# Patient Record
Sex: Female | Born: 1955 | Race: White | Hispanic: No | Marital: Married | Smoking: Former smoker
Health system: Southern US, Community
[De-identification: ages and names within clinical notes are randomized; demographics above are authoritative.]

## PROBLEM LIST (undated history)

## (undated) DIAGNOSIS — I10 Essential (primary) hypertension: Secondary | ICD-10-CM

## (undated) DIAGNOSIS — G4700 Insomnia, unspecified: Secondary | ICD-10-CM

## (undated) DIAGNOSIS — F32A Depression, unspecified: Secondary | ICD-10-CM

## (undated) DIAGNOSIS — Z8632 Personal history of gestational diabetes: Secondary | ICD-10-CM

## (undated) DIAGNOSIS — E7800 Pure hypercholesterolemia, unspecified: Secondary | ICD-10-CM

## (undated) DIAGNOSIS — M8588 Other specified disorders of bone density and structure, other site: Secondary | ICD-10-CM

## (undated) DIAGNOSIS — Z9889 Other specified postprocedural states: Secondary | ICD-10-CM

## (undated) DIAGNOSIS — F419 Anxiety disorder, unspecified: Secondary | ICD-10-CM

## (undated) DIAGNOSIS — B009 Herpesviral infection, unspecified: Secondary | ICD-10-CM

## (undated) HISTORY — PX: SKIN SURGERY: SHX2413

## (undated) HISTORY — DX: Herpesviral infection, unspecified: B00.9

## (undated) HISTORY — DX: Insomnia, unspecified: G47.00

## (undated) HISTORY — DX: Pure hypercholesterolemia, unspecified: E78.00

## (undated) HISTORY — DX: Anxiety disorder, unspecified: F41.9

## (undated) HISTORY — DX: Depression, unspecified: F32.A

## (undated) HISTORY — DX: Essential (primary) hypertension: I10

## (undated) HISTORY — DX: Other specified disorders of bone density and structure, other site: M85.88

## (undated) HISTORY — PX: COSMETIC SURGERY: SHX468

## (undated) HISTORY — DX: Other specified postprocedural states: Z98.890

## (undated) HISTORY — DX: Personal history of gestational diabetes: Z86.32

## (undated) HISTORY — PX: COLPOSCOPY: SHX161

---

## 1998-10-10 ENCOUNTER — Other Ambulatory Visit: Admission: RE | Admit: 1998-10-10 | Discharge: 1998-10-10 | Payer: Self-pay

## 1999-08-14 ENCOUNTER — Encounter: Admission: RE | Admit: 1999-08-14 | Discharge: 1999-08-14 | Payer: Self-pay | Admitting: Family Medicine

## 1999-08-14 ENCOUNTER — Encounter: Payer: Self-pay | Admitting: Family Medicine

## 1999-09-11 ENCOUNTER — Other Ambulatory Visit: Admission: RE | Admit: 1999-09-11 | Discharge: 1999-09-11 | Payer: Self-pay | Admitting: Obstetrics and Gynecology

## 2000-08-18 ENCOUNTER — Encounter: Payer: Self-pay | Admitting: Family Medicine

## 2000-08-18 ENCOUNTER — Encounter: Admission: RE | Admit: 2000-08-18 | Discharge: 2000-08-18 | Payer: Self-pay | Admitting: Family Medicine

## 2000-09-23 ENCOUNTER — Other Ambulatory Visit: Admission: RE | Admit: 2000-09-23 | Discharge: 2000-09-23 | Payer: Self-pay | Admitting: Obstetrics and Gynecology

## 2001-09-08 ENCOUNTER — Ambulatory Visit (HOSPITAL_COMMUNITY): Admission: RE | Admit: 2001-09-08 | Discharge: 2001-09-08 | Payer: Self-pay | Admitting: Obstetrics and Gynecology

## 2001-09-08 ENCOUNTER — Encounter: Payer: Self-pay | Admitting: Obstetrics and Gynecology

## 2001-09-29 ENCOUNTER — Other Ambulatory Visit: Admission: RE | Admit: 2001-09-29 | Discharge: 2001-09-29 | Payer: Self-pay | Admitting: Obstetrics and Gynecology

## 2002-04-06 ENCOUNTER — Encounter: Payer: Self-pay | Admitting: Family Medicine

## 2002-04-06 ENCOUNTER — Encounter: Admission: RE | Admit: 2002-04-06 | Discharge: 2002-04-06 | Payer: Self-pay | Admitting: Family Medicine

## 2002-09-10 ENCOUNTER — Encounter: Payer: Self-pay | Admitting: Obstetrics and Gynecology

## 2002-09-10 ENCOUNTER — Ambulatory Visit (HOSPITAL_COMMUNITY): Admission: RE | Admit: 2002-09-10 | Discharge: 2002-09-10 | Payer: Self-pay | Admitting: Obstetrics and Gynecology

## 2002-10-18 ENCOUNTER — Other Ambulatory Visit: Admission: RE | Admit: 2002-10-18 | Discharge: 2002-10-18 | Payer: Self-pay | Admitting: Obstetrics and Gynecology

## 2002-10-20 ENCOUNTER — Encounter: Payer: Self-pay | Admitting: Obstetrics and Gynecology

## 2002-10-20 ENCOUNTER — Encounter: Admission: RE | Admit: 2002-10-20 | Discharge: 2002-10-20 | Payer: Self-pay | Admitting: Obstetrics and Gynecology

## 2003-04-25 ENCOUNTER — Other Ambulatory Visit: Admission: RE | Admit: 2003-04-25 | Discharge: 2003-04-25 | Payer: Self-pay | Admitting: Obstetrics and Gynecology

## 2003-09-01 ENCOUNTER — Other Ambulatory Visit: Admission: RE | Admit: 2003-09-01 | Discharge: 2003-09-01 | Payer: Self-pay | Admitting: Obstetrics and Gynecology

## 2003-11-11 ENCOUNTER — Other Ambulatory Visit: Admission: RE | Admit: 2003-11-11 | Discharge: 2003-11-11 | Payer: Self-pay | Admitting: Gynecology

## 2003-12-13 ENCOUNTER — Ambulatory Visit (HOSPITAL_COMMUNITY): Admission: RE | Admit: 2003-12-13 | Discharge: 2003-12-13 | Payer: Self-pay | Admitting: Gynecology

## 2004-04-03 ENCOUNTER — Other Ambulatory Visit: Admission: RE | Admit: 2004-04-03 | Discharge: 2004-04-03 | Payer: Self-pay | Admitting: Gynecology

## 2004-10-08 ENCOUNTER — Other Ambulatory Visit: Admission: RE | Admit: 2004-10-08 | Discharge: 2004-10-08 | Payer: Self-pay | Admitting: Gynecology

## 2004-12-14 ENCOUNTER — Ambulatory Visit (HOSPITAL_COMMUNITY): Admission: RE | Admit: 2004-12-14 | Discharge: 2004-12-14 | Payer: Self-pay | Admitting: Gynecology

## 2005-01-29 ENCOUNTER — Other Ambulatory Visit: Admission: RE | Admit: 2005-01-29 | Discharge: 2005-01-29 | Payer: Self-pay | Admitting: Gynecology

## 2005-08-27 ENCOUNTER — Encounter: Admission: RE | Admit: 2005-08-27 | Discharge: 2005-08-27 | Payer: Self-pay | Admitting: Family Medicine

## 2005-12-16 ENCOUNTER — Ambulatory Visit (HOSPITAL_COMMUNITY): Admission: RE | Admit: 2005-12-16 | Discharge: 2005-12-16 | Payer: Self-pay | Admitting: Gynecology

## 2006-01-30 ENCOUNTER — Other Ambulatory Visit: Admission: RE | Admit: 2006-01-30 | Discharge: 2006-01-30 | Payer: Self-pay | Admitting: Gynecology

## 2006-12-19 ENCOUNTER — Ambulatory Visit (HOSPITAL_COMMUNITY): Admission: RE | Admit: 2006-12-19 | Discharge: 2006-12-19 | Payer: Self-pay | Admitting: Gynecology

## 2007-02-02 ENCOUNTER — Other Ambulatory Visit: Admission: RE | Admit: 2007-02-02 | Discharge: 2007-02-02 | Payer: Self-pay | Admitting: Gynecology

## 2007-12-21 ENCOUNTER — Ambulatory Visit (HOSPITAL_COMMUNITY): Admission: RE | Admit: 2007-12-21 | Discharge: 2007-12-21 | Payer: Self-pay | Admitting: Gynecology

## 2008-01-12 ENCOUNTER — Encounter: Admission: RE | Admit: 2008-01-12 | Discharge: 2008-01-27 | Payer: Self-pay | Admitting: Sports Medicine

## 2008-02-03 ENCOUNTER — Other Ambulatory Visit: Admission: RE | Admit: 2008-02-03 | Discharge: 2008-02-03 | Payer: Self-pay | Admitting: Gynecology

## 2008-03-01 ENCOUNTER — Ambulatory Visit: Payer: Self-pay | Admitting: Gynecology

## 2008-07-20 ENCOUNTER — Ambulatory Visit: Payer: Self-pay | Admitting: Gynecology

## 2008-09-20 ENCOUNTER — Ambulatory Visit: Payer: Self-pay | Admitting: Gynecology

## 2008-10-14 ENCOUNTER — Ambulatory Visit: Payer: Self-pay | Admitting: Gynecology

## 2008-10-27 ENCOUNTER — Ambulatory Visit: Payer: Self-pay | Admitting: Gynecology

## 2008-12-22 ENCOUNTER — Ambulatory Visit (HOSPITAL_COMMUNITY): Admission: RE | Admit: 2008-12-22 | Discharge: 2008-12-22 | Payer: Self-pay | Admitting: Gynecology

## 2009-02-03 ENCOUNTER — Encounter: Payer: Self-pay | Admitting: Gynecology

## 2009-02-03 ENCOUNTER — Other Ambulatory Visit: Admission: RE | Admit: 2009-02-03 | Discharge: 2009-02-03 | Payer: Self-pay | Admitting: Gynecology

## 2009-02-03 ENCOUNTER — Ambulatory Visit: Payer: Self-pay | Admitting: Gynecology

## 2009-02-16 ENCOUNTER — Ambulatory Visit: Payer: Self-pay | Admitting: Gynecology

## 2009-03-29 ENCOUNTER — Ambulatory Visit: Payer: Self-pay | Admitting: Women's Health

## 2009-04-03 ENCOUNTER — Ambulatory Visit: Payer: Self-pay | Admitting: Women's Health

## 2009-05-15 ENCOUNTER — Ambulatory Visit: Payer: Self-pay | Admitting: Gynecology

## 2009-05-17 ENCOUNTER — Ambulatory Visit: Payer: Self-pay | Admitting: Gynecology

## 2009-12-06 ENCOUNTER — Ambulatory Visit: Payer: Self-pay | Admitting: Gynecology

## 2009-12-25 ENCOUNTER — Ambulatory Visit (HOSPITAL_COMMUNITY): Admission: RE | Admit: 2009-12-25 | Discharge: 2009-12-25 | Payer: Self-pay | Admitting: Gynecology

## 2010-02-06 ENCOUNTER — Other Ambulatory Visit: Admission: RE | Admit: 2010-02-06 | Discharge: 2010-02-06 | Payer: Self-pay | Admitting: Gynecology

## 2010-02-06 ENCOUNTER — Ambulatory Visit: Payer: Self-pay | Admitting: Gynecology

## 2010-05-31 ENCOUNTER — Ambulatory Visit: Payer: Self-pay | Admitting: Gynecology

## 2010-06-27 ENCOUNTER — Ambulatory Visit
Admission: RE | Admit: 2010-06-27 | Discharge: 2010-06-27 | Payer: Self-pay | Source: Home / Self Care | Attending: Gynecology | Admitting: Gynecology

## 2010-07-18 ENCOUNTER — Ambulatory Visit: Payer: BC Managed Care – PPO | Admitting: Gynecology

## 2010-07-18 DIAGNOSIS — B373 Candidiasis of vulva and vagina: Secondary | ICD-10-CM

## 2010-07-18 DIAGNOSIS — N898 Other specified noninflammatory disorders of vagina: Secondary | ICD-10-CM

## 2010-07-18 DIAGNOSIS — L293 Anogenital pruritus, unspecified: Secondary | ICD-10-CM

## 2010-08-02 ENCOUNTER — Ambulatory Visit (INDEPENDENT_AMBULATORY_CARE_PROVIDER_SITE_OTHER): Payer: BC Managed Care – PPO | Admitting: Gynecology

## 2010-08-02 DIAGNOSIS — B373 Candidiasis of vulva and vagina: Secondary | ICD-10-CM

## 2010-08-02 DIAGNOSIS — N898 Other specified noninflammatory disorders of vagina: Secondary | ICD-10-CM

## 2010-10-22 ENCOUNTER — Ambulatory Visit (INDEPENDENT_AMBULATORY_CARE_PROVIDER_SITE_OTHER): Payer: BC Managed Care – PPO | Admitting: Gynecology

## 2010-10-22 DIAGNOSIS — B373 Candidiasis of vulva and vagina: Secondary | ICD-10-CM

## 2010-10-22 DIAGNOSIS — N898 Other specified noninflammatory disorders of vagina: Secondary | ICD-10-CM

## 2010-10-22 DIAGNOSIS — L293 Anogenital pruritus, unspecified: Secondary | ICD-10-CM

## 2010-11-27 ENCOUNTER — Other Ambulatory Visit: Payer: Self-pay | Admitting: Gynecology

## 2010-11-27 DIAGNOSIS — Z1231 Encounter for screening mammogram for malignant neoplasm of breast: Secondary | ICD-10-CM

## 2010-12-27 ENCOUNTER — Ambulatory Visit (HOSPITAL_COMMUNITY)
Admission: RE | Admit: 2010-12-27 | Discharge: 2010-12-27 | Disposition: A | Discharge status: Home / Self Care | Payer: BC Managed Care – PPO | Source: Ambulatory Visit | Attending: Gynecology | Admitting: Gynecology

## 2010-12-27 DIAGNOSIS — Z1231 Encounter for screening mammogram for malignant neoplasm of breast: Secondary | ICD-10-CM

## 2011-01-29 ENCOUNTER — Ambulatory Visit (INDEPENDENT_AMBULATORY_CARE_PROVIDER_SITE_OTHER): Payer: BC Managed Care – PPO | Admitting: Gynecology

## 2011-01-29 ENCOUNTER — Encounter: Payer: Self-pay | Admitting: Gynecology

## 2011-01-29 DIAGNOSIS — L293 Anogenital pruritus, unspecified: Secondary | ICD-10-CM

## 2011-01-29 DIAGNOSIS — N898 Other specified noninflammatory disorders of vagina: Secondary | ICD-10-CM

## 2011-01-29 DIAGNOSIS — B373 Candidiasis of vulva and vagina: Secondary | ICD-10-CM

## 2011-01-29 DIAGNOSIS — F419 Anxiety disorder, unspecified: Secondary | ICD-10-CM

## 2011-01-29 DIAGNOSIS — F411 Generalized anxiety disorder: Secondary | ICD-10-CM

## 2011-01-29 MED ORDER — CITALOPRAM HYDROBROMIDE 20 MG PO TABS: 20.0000 mg | ORAL_TABLET | Freq: Every day | ORAL | Status: DC

## 2011-01-29 MED ORDER — FLUCONAZOLE 200 MG PO TABS: 200.0000 mg | ORAL_TABLET | Freq: Every day | ORAL | Status: AC

## 2011-01-29 NOTE — Progress Notes (Signed)
Patient presents complaining of vaginal itching and a little bit of discharge. 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 She admits to missing some of her Vagifem and thinks that may be related to this. She's also having a lot of anxiety with her children going to college. She has crying spells and just feeling sad.  Exam Pelvic: External BUS vagina whitish discharge KOH wet prep done  Assessment and plan: #1 Yeast vaginitis. Wet prep returned positive for yeast under treat her with Diflucan 200 daily for 5 days as we have done the past which seems to eradicate her yeast. She is leaving on a trip tomorrow am afraid and a shorter course she may fail while she is out of town. #2 Anxiety crying spells. The patient's having situational anxiety with her children off at school, her husband and her now empty nesters. Various options were reviewed she wants to start on an anxiolytic and I prescribed Celexa 20 mg daily. I discussed the side effect profiles of up to and including the risks of worsening depression suicide ideation although she understands and accepts. #30 with 6 refills prescribed. Patient is due for her annual coming up and she'll follow up for this sooner if any issues.

## 2011-02-12 ENCOUNTER — Encounter: Payer: Self-pay | Admitting: Gynecology

## 2011-02-12 ENCOUNTER — Other Ambulatory Visit (HOSPITAL_COMMUNITY)
Admission: RE | Admit: 2011-02-12 | Discharge: 2011-02-12 | Disposition: A | Discharge status: Home / Self Care | Payer: BC Managed Care – PPO | Source: Ambulatory Visit | Attending: Gynecology | Admitting: Gynecology

## 2011-02-12 ENCOUNTER — Ambulatory Visit (INDEPENDENT_AMBULATORY_CARE_PROVIDER_SITE_OTHER): Payer: BC Managed Care – PPO | Admitting: Gynecology

## 2011-02-12 VITALS — BP 110/70 | Ht 65.75 in | Wt 124.5 lb

## 2011-02-12 DIAGNOSIS — B373 Candidiasis of vulva and vagina: Secondary | ICD-10-CM

## 2011-02-12 DIAGNOSIS — N952 Postmenopausal atrophic vaginitis: Secondary | ICD-10-CM

## 2011-02-12 DIAGNOSIS — Z79890 Hormone replacement therapy (postmenopausal): Secondary | ICD-10-CM

## 2011-02-12 DIAGNOSIS — L293 Anogenital pruritus, unspecified: Secondary | ICD-10-CM

## 2011-02-12 DIAGNOSIS — Z01419 Encounter for gynecological examination (general) (routine) without abnormal findings: Secondary | ICD-10-CM

## 2011-02-12 DIAGNOSIS — N898 Other specified noninflammatory disorders of vagina: Secondary | ICD-10-CM

## 2011-02-12 DIAGNOSIS — B3731 Acute candidiasis of vulva and vagina: Secondary | ICD-10-CM

## 2011-02-12 MED ORDER — ESTRADIOL 10 MCG VA TABS: 1.0000 | ORAL_TABLET | VAGINAL | Status: DC

## 2011-02-12 MED ORDER — PROGESTERONE MICRONIZED 200 MG PO CAPS: 200.0000 mg | ORAL_CAPSULE | Freq: Every day | ORAL | Status: DC

## 2011-02-12 MED ORDER — TERCONAZOLE 0.8 % VA CREA: 1.0000 | TOPICAL_CREAM | Freq: Every day | VAGINAL | Status: AC

## 2011-02-12 NOTE — Progress Notes (Signed)
Kristina Moody [facility] [birth year] [id number]        55 y.o.  for annual exam.  Currently on Vagifem 10 mcg 3 times weekly and estradiol 2 mg daily Prometrium 200 mg 13 days each month no bleeding doing well. Recently started on Celexa and has had a good response with a few crying spells. Was recently treated for yeast vulvovaginitis got better but now seems to have a recurrence of itching with discharge.  Past medical history,surgical history, medications, allergies, family history and social history were all reviewed and documented in the EPIC chart. ROS:  Was performed and pertinent positives and negatives are included in the history.  Exam: chaperone present Filed Vitals:   02/12/11 1406  BP: 110/70   General appearance  Normal Skin grossly normal Head/Neck normal with no cervical or supraclavicular adenopathy thyroid normal Lungs  clear Cardiac RR, without RMG Abdominal  soft, nontender, without masses, organomegaly or hernia Breasts  examined lying and sitting without masses, retractions, discharge or axillary adenopathy. Pelvic  Ext/BUS/vagina  normal white discharge KOH wet prep done mild atrophic genital changes noted  Cervix  normal  Pap done  Uterus  anteverted, normal size, shape and contour, midline and mobile nontender   Adnexa  Without masses or tenderness    Anus and perineum  normal   Rectovaginal  normal sphincter tone without palpated masses or tenderness.    Assessment/Plan:  55 y.o. female for annual exam.    #1 White discharge. KOH wet prep is positive for yeast we'll treat with Terazol 3 cream followup if symptoms persist or recur. #2 HRT. Patient is doing well on HRT with a combination of vaginal and oral estrogen supplementation. We again reviewed the [facility] study risks of stroke heart attack DVT breast cancer risk all reviewed she understands accepts and wants to continue. I refilled her estradiol 2 mg daily Prometrium 200 mg for 14 days each month again she does not  withdraw bleeding and Vagifem 10 mcg 3 times weekly. Patient knows to report any bleeding or other issues. #3 Anxiety. Patient's done well with Celexa and she is going to continue on this. She has enough refills through one year. #4 Health maintenance. Self breast exams on a monthly basis discussed encouraged. She just had mammography in June we'll continue with annual mammography.  Colonoscopy was 5 years ago and she knows to schedule one this fall as recommended by her gastroenterologist. Bone density last year showed osteopenia she is on extra calcium vitamin D with planned repeat bone density next year a two-year interval.  No blood work was done today as is all done through her primary's office.    Dara Lords MD, 2:59 PM 02/12/2011

## 2011-03-08 ENCOUNTER — Other Ambulatory Visit: Payer: Self-pay | Admitting: *Deleted

## 2011-03-08 NOTE — Telephone Encounter (Signed)
PHARMACY FAXED REFILL REQUEST. PLEASE ADVISE.

## 2011-03-08 NOTE — Telephone Encounter (Signed)
ERROR

## 2011-03-11 ENCOUNTER — Other Ambulatory Visit: Payer: Self-pay | Admitting: *Deleted

## 2011-03-11 MED ORDER — TERCONAZOLE 0.8 % VA CREA: 1.0000 | TOPICAL_CREAM | Freq: Every day | VAGINAL | Status: AC

## 2011-03-11 NOTE — Telephone Encounter (Signed)
Pharmacy faxed refill request. 

## 2011-03-12 ENCOUNTER — Telehealth: Payer: Self-pay | Admitting: *Deleted

## 2011-03-12 MED ORDER — ESTRADIOL 1 MG PO TABS: 1.0000 mg | ORAL_TABLET | Freq: Every day | ORAL | Status: DC

## 2011-03-12 MED ORDER — ESTROGENS CONJ SYNTHETIC A 1.25 MG PO TABS: 1.2500 mg | ORAL_TABLET | Freq: Every day | ORAL | Status: DC

## 2011-03-12 NOTE — Telephone Encounter (Signed)
Addended by: Richardson Chiquito on: 03/12/2011 04:52 PM   Modules accepted: Orders

## 2011-03-12 NOTE — Telephone Encounter (Signed)
Estradiol Rx on [employer] back order. Dr Reece Agar has a standing Rx change protocol. See new Rx. Pt informed. Kw

## 2011-03-12 NOTE — Telephone Encounter (Signed)
Pharmacy has some estradiol so therefore Rx changed back to that.

## 2011-06-18 ENCOUNTER — Encounter: Payer: Self-pay | Admitting: Gynecology

## 2011-06-18 ENCOUNTER — Ambulatory Visit (INDEPENDENT_AMBULATORY_CARE_PROVIDER_SITE_OTHER): Payer: BC Managed Care – PPO | Admitting: Gynecology

## 2011-06-18 DIAGNOSIS — N898 Other specified noninflammatory disorders of vagina: Secondary | ICD-10-CM

## 2011-06-18 LAB — WET PREP FOR TRICH, YEAST, CLUE
Clue Cells Wet Prep HPF POC: NONE SEEN
Yeast Wet Prep HPF POC: NONE SEEN

## 2011-06-18 MED ORDER — FLUCONAZOLE 200 MG PO TABS: 200.0000 mg | ORAL_TABLET | Freq: Every day | ORAL | Status: AC

## 2011-06-18 NOTE — Progress Notes (Signed)
Patient presents with history of recurrent yeast vulvovaginitis. She had missed her Vagifem per week and notes now some vaginal itching and discharge. She used one applicator Terazol cream she had last night.  Exam with Sherri chaperone present External BUS vagina with white discharge. Cervix grossly normal  Assessment and plan wet prep is negative but given history I wanted to cover her with Diflucan 200 daily x5 days as I think this is a partially treated yeast due to the [place] application. I did give her 2 refills available she does have recurrences. She will follow up if this does not resolve her symptoms.

## 2011-06-18 NOTE — Patient Instructions (Signed)
Take Diflucan as instructed. Follow up if your symptoms persist.

## 2011-07-12 ENCOUNTER — Encounter: Payer: Self-pay | Admitting: Gynecology

## 2011-07-12 ENCOUNTER — Ambulatory Visit (INDEPENDENT_AMBULATORY_CARE_PROVIDER_SITE_OTHER): Payer: BC Managed Care – PPO | Admitting: Gynecology

## 2011-07-12 DIAGNOSIS — N899 Noninflammatory disorder of vagina, unspecified: Secondary | ICD-10-CM

## 2011-07-12 DIAGNOSIS — N898 Other specified noninflammatory disorders of vagina: Secondary | ICD-10-CM

## 2011-07-12 LAB — WET PREP FOR TRICH, YEAST, CLUE: Yeast Wet Prep HPF POC: NONE SEEN

## 2011-07-12 MED ORDER — BETAMETHASONE DIPROPIONATE AUG 0.05 % EX CREA: TOPICAL_CREAM | Freq: Two times a day (BID) | CUTANEOUS | Status: AC

## 2011-07-12 MED ORDER — CLINDAMYCIN PHOSPHATE 2 % VA CREA: 1.0000 | TOPICAL_CREAM | Freq: Every day | VAGINAL | Status: AC

## 2011-07-12 NOTE — Patient Instructions (Signed)
Use Cleocin cream nightly x1 week. Apply steroid cream externally twice daily. Follow up if symptoms persist or recur

## 2011-07-12 NOTE — Progress Notes (Signed)
Patient presents with a one to two-week history of vulvar irritation. She does have a history of recurrent vaginitis with an underlying atrophic vaginitis. She's on oral and vaginal estrogen supplementation with Vagifem. She notes that a week and a half ago the onset of some irritation and started oral Diflucan 200x5 days and she had an external Monistat per irritation has persisted.  Exam was [name] chaperone present External with some mild skin cracking in the lower labial folds bilaterally. Vagina with whitish discharge. Cervix normal. Uterus normal size midline mobile nontender. Adnexa without masses or tenderness.  Assessment and plan: Wet prep shows TNBC bacteria, few clue cells, no amine, no yeast. Question partially treated yeast versus BV. She has been using Diflucan and Monistat go ahead and cover her for BV with Cleocin vaginal cream nightly times a week at her choice. I prescribed Diprolene 0.05% cream to apply twice a day externally for the your patient. Follow up if symptoms persist or recur.

## 2011-08-28 ENCOUNTER — Encounter: Payer: Self-pay | Admitting: Gynecology

## 2011-08-28 ENCOUNTER — Ambulatory Visit (INDEPENDENT_AMBULATORY_CARE_PROVIDER_SITE_OTHER): Payer: BC Managed Care – PPO | Admitting: Gynecology

## 2011-08-28 VITALS — BP 128/86

## 2011-08-28 DIAGNOSIS — Z79890 Hormone replacement therapy (postmenopausal): Secondary | ICD-10-CM

## 2011-08-28 DIAGNOSIS — F419 Anxiety disorder, unspecified: Secondary | ICD-10-CM

## 2011-08-28 DIAGNOSIS — F411 Generalized anxiety disorder: Secondary | ICD-10-CM

## 2011-08-28 MED ORDER — CITALOPRAM HYDROBROMIDE 20 MG PO TABS: 20.0000 mg | ORAL_TABLET | Freq: Every day | ORAL | Status: DC

## 2011-08-28 NOTE — Patient Instructions (Signed)
Follow up in 6 months for your annual gynecologic exam

## 2011-08-28 NOTE — Progress Notes (Signed)
Patient presents in follow up for Celexa management. She is started on this for anxiety about 6 months ago after follow up in 6 months just to see how she is doing. Patient reports doing great. It has stabilized her mood. She still dealing with elderly parents and wants to continue on this. Does not appear to have any side effects from this. I went ahead and refilled her medication for 6 months and she'll be due for her annual at that time. She's also doing well on her HRT having predictable withdrawal bleeds on her intermittent progesterone withdrawals. Assuming she continues well then she'll see me in 6 months for her annual exam.

## 2011-11-06 ENCOUNTER — Ambulatory Visit (INDEPENDENT_AMBULATORY_CARE_PROVIDER_SITE_OTHER): Payer: BC Managed Care – PPO | Admitting: Surgery

## 2011-11-06 ENCOUNTER — Encounter (INDEPENDENT_AMBULATORY_CARE_PROVIDER_SITE_OTHER): Payer: Self-pay | Admitting: Surgery

## 2011-11-06 VITALS — BP 150/70 | HR 84 | Temp 97.8°F | Resp 18 | Ht 66.0 in | Wt 128.0 lb

## 2011-11-06 DIAGNOSIS — K602 Anal fissure, unspecified: Secondary | ICD-10-CM

## 2011-11-06 MED ORDER — AMBULATORY NON FORMULARY MEDICATION: 1.0000 "application " | Freq: Four times a day (QID) | Status: DC

## 2011-11-06 NOTE — Progress Notes (Signed)
Kristina Moody comes in today with some pain in the perianal region thought to be hemorrhoids. She does have scheduled colonoscopies and is followed for that. The recent complaints of been post defecation bleeding which was bright red in nature and discomfort. There is some burning with this and what she considered to be fixed internal hemorrhoids.  I performed anoscopy  first with a small scope and then with the larger scope. Posteriorly she has what feels like a sentinel tag and there is a for row that isn't uncomfortable I think she is intermittently irritated an anal fissure.  I will give her a prescription for diltiazem cream. I will see her back in the office if needed in 6 weeks otherwise he will know to manage these expectantly. When they slurred up she had some anal rectal suppositories which apparently were effective.

## 2011-11-06 NOTE — Patient Instructions (Signed)
Avoid sitting for prolonged periods of time and straining on the toilet. He used the diltiazem cream as indicated. Avoid constipation.

## 2011-11-25 ENCOUNTER — Other Ambulatory Visit: Payer: Self-pay | Admitting: Gynecology

## 2011-11-25 DIAGNOSIS — Z1231 Encounter for screening mammogram for malignant neoplasm of breast: Secondary | ICD-10-CM

## 2011-11-26 ENCOUNTER — Telehealth: Payer: Self-pay | Admitting: *Deleted

## 2011-11-26 MED ORDER — TERCONAZOLE 0.8 % VA CREA: 1.0000 | TOPICAL_CREAM | Freq: Every day | VAGINAL | Status: AC

## 2011-11-26 NOTE — Telephone Encounter (Signed)
Pt will be leaving for vacation for 8 days this week and would like a Rx for Terazol cream to take with her.  She does not have yeast infection now, but would like medication on hand just in case it should occur. Pt said you have done this before. Please advise.

## 2011-11-26 NOTE — Telephone Encounter (Signed)
Pt informed with the below note. 

## 2011-11-26 NOTE — Telephone Encounter (Signed)
okay

## 2011-12-31 ENCOUNTER — Ambulatory Visit (HOSPITAL_COMMUNITY)
Admission: RE | Admit: 2011-12-31 | Discharge: 2011-12-31 | Disposition: A | Discharge status: Home / Self Care | Payer: BC Managed Care – PPO | Source: Ambulatory Visit | Attending: Gynecology | Admitting: Gynecology

## 2011-12-31 DIAGNOSIS — Z1231 Encounter for screening mammogram for malignant neoplasm of breast: Secondary | ICD-10-CM

## 2012-02-13 ENCOUNTER — Encounter: Payer: Self-pay | Admitting: Gynecology

## 2012-02-13 ENCOUNTER — Other Ambulatory Visit (HOSPITAL_COMMUNITY)
Admission: RE | Admit: 2012-02-13 | Discharge: 2012-02-13 | Disposition: A | Discharge status: Home / Self Care | Payer: BC Managed Care – PPO | Source: Ambulatory Visit | Attending: Gynecology | Admitting: Gynecology

## 2012-02-13 ENCOUNTER — Ambulatory Visit (INDEPENDENT_AMBULATORY_CARE_PROVIDER_SITE_OTHER): Payer: BC Managed Care – PPO | Admitting: Gynecology

## 2012-02-13 VITALS — BP 142/82 | Ht 65.75 in | Wt 129.0 lb

## 2012-02-13 DIAGNOSIS — N898 Other specified noninflammatory disorders of vagina: Secondary | ICD-10-CM

## 2012-02-13 DIAGNOSIS — Z1151 Encounter for screening for human papillomavirus (HPV): Secondary | ICD-10-CM | POA: Insufficient documentation

## 2012-02-13 DIAGNOSIS — Z79890 Hormone replacement therapy (postmenopausal): Secondary | ICD-10-CM

## 2012-02-13 DIAGNOSIS — Z01419 Encounter for gynecological examination (general) (routine) without abnormal findings: Secondary | ICD-10-CM

## 2012-02-13 LAB — WET PREP FOR TRICH, YEAST, CLUE
Trich, Wet Prep: NONE SEEN
Yeast Wet Prep HPF POC: NONE SEEN

## 2012-02-13 MED ORDER — ESTRADIOL 1 MG PO TABS: 1.0000 mg | ORAL_TABLET | Freq: Every day | ORAL | Status: DC

## 2012-02-13 MED ORDER — ESTROGENS, CONJUGATED 0.625 MG/GM VA CREA: TOPICAL_CREAM | Freq: Every day | VAGINAL | Status: AC

## 2012-02-13 MED ORDER — TERCONAZOLE 0.8 % VA CREA: 1.0000 | TOPICAL_CREAM | Freq: Every day | VAGINAL | Status: AC

## 2012-02-13 MED ORDER — PROGESTERONE MICRONIZED 200 MG PO CAPS: 200.0000 mg | ORAL_CAPSULE | Freq: Every day | ORAL | Status: DC

## 2012-02-13 MED ORDER — BETAMETHASONE DIPROPIONATE AUG 0.05 % EX CREA: TOPICAL_CREAM | Freq: Two times a day (BID) | CUTANEOUS | Status: DC

## 2012-02-13 NOTE — Progress Notes (Signed)
Kristina Moody [facility] 31-May-1956 [id number]        56 y.o.  [id number] for annual exam.  Several issues noted below.  Past medical history,surgical history, medications, allergies, family history and social history were all reviewed and documented in the EPIC chart. ROS:  Was performed and pertinent positives and negatives are included in the history.  Exam: [name] assistant Filed Vitals:   02/13/12 1353  BP: 142/82  Height: 5' 5.75" (1.67 m)  Weight: 129 lb (58.514 kg)   General appearance  Normal Skin grossly normal Head/Neck normal with no cervical or supraclavicular adenopathy thyroid normal Lungs  clear Cardiac RR, without RMG Abdominal  soft, nontender, without masses, organomegaly or hernia Breasts  examined lying and sitting without masses, retractions, discharge or axillary adenopathy. Pelvic  Ext/BUS/vagina  normal with mild atrophic changes  Cervix  normal with mild atrophic changes  Uterus  anteverted, normal size, shape and contour, midline and mobile nontender   Adnexa  Without masses or tenderness    Anus and perineum  normal   Rectovaginal  normal sphincter tone without palpated masses or tenderness.    Assessment/Plan:  56 y.o. [id number] female for annual exam.   1. HRT. Patient continues on Estrace 1 mg daily and Prometrium 200 mg daily for the first 13 days each month with no withdrawal bleeding. We discussed previously and again today the options of changing to a daily progesterone. Patient and I both agreed not to change it as she is not having any issues at this time as far as bleeding and good relief from hot flashes night sweats symptoms. He.  Risks of stroke heart attack DVT possible breast cancer risk of all been reviewed with the patient on multiple occasions and accepted. 2. Vaginitis. Patient recently had airplane trip and had a lot of vaginal irritation. She is to [employer] 3 day notes some persistent irritation. She has been using Diprolene in the past which seemed  to help with moisture generated vaginitis I refilled her Diprolene 0.05% to be used when necessary.  Wet prep today is negative. Will monitor symptoms. I did refill her Terazol 3 cream to have available for recurrences in the future. 3. Atrophic vaginitis/dyspareunia. Patients on Vagifem 10 mcg still with some issues. I suggested stopping this and trying estrogen cream externally several times weekly to see if this doesn't help with the atrophic changes she's going to try this I prescribed Premarin vaginal cream per her insurance coverage to apply several times weekly. 4. Mammography. Patient had mammogram in July. We'll continue with annual mammography. SBE monthly reviewed. 5. Osteopenia. DEXA 05/2010 showed T score -1.1. Plan repeat in another year or 2. Increase calcium vitamin D reviewed. 6. Pap smear. Last Pap smear 2011. Pap/HPV done today.  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 If this Pap is normal then plan less frequent screening interval. 7. Colonoscopy. Patient has scheduled in October will follow up for this. 8. Health maintenance. No blood work was done today as is all done through her primary physician's office who she sees on a regular basis. Mild elevated blood pressure was discussed with patient she will follow up and have this rechecked at their office. Follow up one year, sooner as needed.    Dara Lords MD, 3:09 PM 02/13/2012

## 2012-02-13 NOTE — Patient Instructions (Signed)
Follow up one year for annual gynecologic exam 

## 2012-02-14 ENCOUNTER — Encounter: Payer: Self-pay | Admitting: Gynecology

## 2012-04-06 ENCOUNTER — Other Ambulatory Visit: Payer: Self-pay | Admitting: Gastroenterology

## 2012-04-10 ENCOUNTER — Other Ambulatory Visit: Payer: Self-pay | Admitting: *Deleted

## 2012-04-10 MED ORDER — ESTRADIOL 10 MCG VA TABS: 10.0000 ug | ORAL_TABLET | VAGINAL | Status: DC

## 2012-07-13 ENCOUNTER — Ambulatory Visit (INDEPENDENT_AMBULATORY_CARE_PROVIDER_SITE_OTHER): Payer: No Typology Code available for payment source | Admitting: Gynecology

## 2012-07-13 ENCOUNTER — Encounter: Payer: Self-pay | Admitting: Gynecology

## 2012-07-13 DIAGNOSIS — Z79899 Other long term (current) drug therapy: Secondary | ICD-10-CM

## 2012-07-13 MED ORDER — PROGESTERONE MICRONIZED 200 MG PO CAPS: 200.0000 mg | ORAL_CAPSULE | Freq: Every day | ORAL | Status: DC

## 2012-07-13 MED ORDER — ONDANSETRON HCL 4 MG PO TABS: 4.0000 mg | ORAL_TABLET | Freq: Three times a day (TID) | ORAL | Status: DC | PRN

## 2012-07-13 MED ORDER — CIPROFLOXACIN HCL 500 MG PO TABS: 500.0000 mg | ORAL_TABLET | Freq: Two times a day (BID) | ORAL | Status: DC

## 2012-07-13 MED ORDER — FLUCONAZOLE 200 MG PO TABS: 200.0000 mg | ORAL_TABLET | Freq: Every day | ORAL | Status: DC

## 2012-07-13 NOTE — Progress Notes (Signed)
Patient presents to talk about medications for upcoming overseas trip. She is planning to travel to [place] and [place]. I recommended ciprofloxacin 500 mg twice a day x7 days to have available if UTI/respiratory/food poisoning. Zofran 4 mg #20 when necessary nausea. Diflucan 200 mg #5 when necessary yeast infection and she does get yeast infections frequently. I wrote for her Prometrium 200 mg #30 as she does take this 13 days each month with her HRT. Is having no withdrawal bleeding with this but apparently is having some insurance issues getting it filled early with her travels I gave her a supply of available.

## 2012-07-13 NOTE — Patient Instructions (Signed)
Take ciprofloxacin twice daily for one week if signs or symptoms of UTI, respiratory infection or food poisoning. Take Zofran every 8 hours for nausea as needed Take Diflucan tablet as needed for yeast.

## 2012-09-15 ENCOUNTER — Other Ambulatory Visit: Payer: Self-pay

## 2012-09-15 DIAGNOSIS — Z79890 Hormone replacement therapy (postmenopausal): Secondary | ICD-10-CM

## 2012-09-15 MED ORDER — PROGESTERONE MICRONIZED 200 MG PO CAPS: 200.0000 mg | ORAL_CAPSULE | Freq: Every day | ORAL | Status: DC

## 2012-12-28 ENCOUNTER — Other Ambulatory Visit: Payer: Self-pay | Admitting: Gynecology

## 2012-12-28 DIAGNOSIS — Z1231 Encounter for screening mammogram for malignant neoplasm of breast: Secondary | ICD-10-CM

## 2013-01-04 ENCOUNTER — Telehealth: Payer: Self-pay | Admitting: *Deleted

## 2013-01-04 MED ORDER — ZOLPIDEM TARTRATE 10 MG PO TABS: 10.0000 mg | ORAL_TABLET | Freq: Every evening | ORAL | Status: DC | PRN

## 2013-01-04 NOTE — Telephone Encounter (Signed)
Ambien 10 mg 1 by mouth each bedtime when necessary insomnia #30 no refill

## 2013-01-04 NOTE — Telephone Encounter (Signed)
rx called in, pt informed. 

## 2013-01-04 NOTE — Telephone Encounter (Signed)
Pt is getting ready to travel [place] and said you spoke with her about having rx to help with sleep? Pt called to see if you were still willing to give her this rx? Please advise

## 2013-01-05 ENCOUNTER — Ambulatory Visit (HOSPITAL_COMMUNITY): Payer: No Typology Code available for payment source

## 2013-02-02 ENCOUNTER — Ambulatory Visit (HOSPITAL_COMMUNITY)
Admission: RE | Admit: 2013-02-02 | Discharge: 2013-02-02 | Disposition: A | Discharge status: Home / Self Care | Payer: No Typology Code available for payment source | Source: Ambulatory Visit | Attending: Gynecology | Admitting: Gynecology

## 2013-02-02 DIAGNOSIS — Z1231 Encounter for screening mammogram for malignant neoplasm of breast: Secondary | ICD-10-CM | POA: Insufficient documentation

## 2013-03-03 ENCOUNTER — Other Ambulatory Visit: Payer: Self-pay | Admitting: *Deleted

## 2013-03-03 MED ORDER — ESTRADIOL 1 MG PO TABS: 1.0000 mg | ORAL_TABLET | Freq: Every day | ORAL | Status: DC

## 2013-03-11 ENCOUNTER — Telehealth: Payer: Self-pay | Admitting: *Deleted

## 2013-03-11 NOTE — Telephone Encounter (Signed)
Pt will be leaving for a 14 day overseas trip and would like a Rx for Ambien and Terazol to have on hand while on trip. Please advise

## 2013-03-15 MED ORDER — TERCONAZOLE 0.8 % VA CREA: 1.0000 | TOPICAL_CREAM | Freq: Every day | VAGINAL | Status: DC

## 2013-03-15 MED ORDER — ZOLPIDEM TARTRATE 10 MG PO TABS: 10.0000 mg | ORAL_TABLET | Freq: Every evening | ORAL | Status: DC | PRN

## 2013-03-15 NOTE — Telephone Encounter (Signed)
Terazol 3 day cream #1 kit Ambien 10 mg #20 one by mouth each bedtime when necessary insomnia no refill

## 2013-03-15 NOTE — Telephone Encounter (Signed)
Please see below note

## 2013-03-15 NOTE — Telephone Encounter (Signed)
Left message on pt voicemail to call regarding this. 

## 2013-03-15 NOTE — Telephone Encounter (Signed)
Ambien called into pharmacy, and Terazol 3 days cream sent. Pt informed,

## 2013-03-26 ENCOUNTER — Other Ambulatory Visit: Payer: Self-pay

## 2013-03-26 MED ORDER — ESTROGENS, CONJUGATED 0.625 MG/GM VA CREA: TOPICAL_CREAM | Freq: Every day | VAGINAL | Status: DC

## 2013-04-13 ENCOUNTER — Encounter: Payer: Self-pay | Admitting: Gynecology

## 2013-04-20 ENCOUNTER — Other Ambulatory Visit: Payer: Self-pay | Admitting: Gynecology

## 2013-04-20 NOTE — Telephone Encounter (Signed)
Has her CE scheduled in Dec 2014.

## 2013-05-05 ENCOUNTER — Other Ambulatory Visit: Payer: Self-pay | Admitting: Gynecology

## 2013-05-13 ENCOUNTER — Other Ambulatory Visit: Payer: Self-pay | Admitting: Gynecology

## 2013-05-20 ENCOUNTER — Ambulatory Visit (INDEPENDENT_AMBULATORY_CARE_PROVIDER_SITE_OTHER): Payer: No Typology Code available for payment source | Admitting: Gynecology

## 2013-05-20 ENCOUNTER — Encounter: Payer: Self-pay | Admitting: Gynecology

## 2013-05-20 VITALS — BP 124/80 | Ht 66.0 in | Wt 125.0 lb

## 2013-05-20 DIAGNOSIS — E7800 Pure hypercholesterolemia, unspecified: Secondary | ICD-10-CM | POA: Insufficient documentation

## 2013-05-20 DIAGNOSIS — N952 Postmenopausal atrophic vaginitis: Secondary | ICD-10-CM

## 2013-05-20 DIAGNOSIS — B009 Herpesviral infection, unspecified: Secondary | ICD-10-CM | POA: Insufficient documentation

## 2013-05-20 DIAGNOSIS — Z01419 Encounter for gynecological examination (general) (routine) without abnormal findings: Secondary | ICD-10-CM

## 2013-05-20 DIAGNOSIS — M8580 Other specified disorders of bone density and structure, unspecified site: Secondary | ICD-10-CM

## 2013-05-20 DIAGNOSIS — Z79890 Hormone replacement therapy (postmenopausal): Secondary | ICD-10-CM

## 2013-05-20 DIAGNOSIS — M899 Disorder of bone, unspecified: Secondary | ICD-10-CM

## 2013-05-20 LAB — URINALYSIS W MICROSCOPIC + REFLEX CULTURE
Bacteria, UA: NONE SEEN
Casts: NONE SEEN
Glucose, UA: NEGATIVE mg/dL
Hgb urine dipstick: NEGATIVE
Ketones, ur: NEGATIVE mg/dL
Protein, ur: NEGATIVE mg/dL
pH: 6.5 (ref 5.0–8.0)

## 2013-05-20 MED ORDER — PROGESTERONE MICRONIZED 200 MG PO CAPS: ORAL_CAPSULE | ORAL | Status: DC

## 2013-05-20 MED ORDER — BETAMETHASONE DIPROPIONATE AUG 0.05 % EX CREA: TOPICAL_CREAM | CUTANEOUS | Status: DC

## 2013-05-20 MED ORDER — ESTROGENS, CONJUGATED 0.625 MG/GM VA CREA: TOPICAL_CREAM | Freq: Every day | VAGINAL | Status: DC

## 2013-05-20 MED ORDER — ESTRADIOL 1 MG PO TABS: ORAL_TABLET | ORAL | Status: DC

## 2013-05-20 NOTE — Patient Instructions (Signed)
Followup for bone density as scheduled. Followup in one year for annual exam 

## 2013-05-20 NOTE — Progress Notes (Signed)
Nadia Viar [facility] 1956-04-26 [id number]        57 y.o.  [id number] for annual exam. Several issues below.  Past medical history,surgical history, problem list, medications, allergies, family history and social history were all reviewed and documented in the EPIC chart.  ROS:  Performed and pertinent positives and negatives are included in the history, assessment and plan .  Exam: Kim assistant Filed Vitals:   05/20/13 0806  BP: 124/80  Height: 5\' 6"  (1.676 m)  Weight: 125 lb (56.7 kg)   General appearance  Normal Skin grossly normal Head/Neck normal with no cervical or supraclavicular adenopathy thyroid normal Lungs  clear Cardiac RR, without RMG Abdominal  soft, nontender, without masses, organomegaly or hernia Breasts  examined lying and sitting without masses, retractions, discharge or axillary adenopathy. Pelvic  Ext/BUS/vagina  Normal with mild atrophic changes  Cervix  Normal   Uterus  anteverted, normal size, shape and contour, midline and mobile nontender   Adnexa  Without masses or tenderness    Anus and perineum  Normal   Rectovaginal  Normal sphincter tone without palpated masses or tenderness.    Assessment/Plan:  57 y.o. [id number] female for annual exam.   1. Postmenopausal/HRT. Patient continues on Estrace 1 mg daily and Prometrium 200 mg x14 days each month. Also supplementing with Premarin vaginal cream twice weekly. I again reviewed the risks benefits of HRT including increased risk of stroke heart attack DVT and breast cancer. Patient's comfortable with continuing and I refilled her x1 year. Also uses Diprolene 0.5% cream when necessary for external vaginal itching and I refilled this also. 2. Osteopenia. DEXA 05/2010 with T score -1.1. Repeat DEXA now. Increase calcium vitamin D reviewed. 3. Pap smear/HPV 2013. No Pap smear done today. 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 Will continue to screen at a less frequent interval. 4. Mammography  01/2013. Continue with annual mammography. SBE monthly review. 5. Colonoscopy 2014. Repeat at their recommended interval. 6. Health maintenance. No blood work done as it is all done through her primary physician's office. Followup for DEXA otherwise 1 year, sooner as needed   Note: This document was prepared with digital dictation and possible smart phrase technology. Any transcriptional errors that result from this process are unintentional.   Dara Lords MD, 8:41 AM 05/20/2013

## 2013-05-23 LAB — URINE CULTURE: Colony Count: 9000

## 2013-06-01 ENCOUNTER — Ambulatory Visit (INDEPENDENT_AMBULATORY_CARE_PROVIDER_SITE_OTHER): Payer: No Typology Code available for payment source

## 2013-06-01 ENCOUNTER — Encounter: Payer: Self-pay | Admitting: Gynecology

## 2013-06-01 DIAGNOSIS — M899 Disorder of bone, unspecified: Secondary | ICD-10-CM

## 2013-06-01 DIAGNOSIS — M8580 Other specified disorders of bone density and structure, unspecified site: Secondary | ICD-10-CM

## 2014-01-05 ENCOUNTER — Other Ambulatory Visit: Payer: Self-pay | Admitting: Gynecology

## 2014-01-05 DIAGNOSIS — Z1231 Encounter for screening mammogram for malignant neoplasm of breast: Secondary | ICD-10-CM

## 2014-02-04 ENCOUNTER — Ambulatory Visit (HOSPITAL_COMMUNITY)
Admission: RE | Admit: 2014-02-04 | Discharge: 2014-02-04 | Disposition: A | Discharge status: Home / Self Care | Payer: No Typology Code available for payment source | Source: Ambulatory Visit | Attending: Gynecology | Admitting: Gynecology

## 2014-02-04 DIAGNOSIS — Z1231 Encounter for screening mammogram for malignant neoplasm of breast: Secondary | ICD-10-CM | POA: Diagnosis present

## 2014-04-11 ENCOUNTER — Encounter: Payer: Self-pay | Admitting: Gynecology

## 2014-05-23 ENCOUNTER — Other Ambulatory Visit: Payer: Self-pay | Admitting: Gynecology

## 2014-05-24 ENCOUNTER — Ambulatory Visit (INDEPENDENT_AMBULATORY_CARE_PROVIDER_SITE_OTHER): Payer: No Typology Code available for payment source | Admitting: Gynecology

## 2014-05-24 ENCOUNTER — Encounter: Payer: Self-pay | Admitting: Gynecology

## 2014-05-24 ENCOUNTER — Encounter: Payer: No Typology Code available for payment source | Admitting: Gynecology

## 2014-05-24 VITALS — BP 120/68 | Ht 65.0 in | Wt 123.0 lb

## 2014-05-24 DIAGNOSIS — Z01419 Encounter for gynecological examination (general) (routine) without abnormal findings: Secondary | ICD-10-CM

## 2014-05-24 DIAGNOSIS — M8580 Other specified disorders of bone density and structure, unspecified site: Secondary | ICD-10-CM

## 2014-05-24 DIAGNOSIS — Z79890 Hormone replacement therapy (postmenopausal): Secondary | ICD-10-CM

## 2014-05-24 MED ORDER — PROGESTERONE MICRONIZED 200 MG PO CAPS: ORAL_CAPSULE | ORAL | Status: DC

## 2014-05-24 MED ORDER — ESTRADIOL 1 MG PO TABS: ORAL_TABLET | ORAL | Status: DC

## 2014-05-24 NOTE — Patient Instructions (Signed)
You may obtain a copy of any labs that were done today by logging onto MyChart as outlined in the instructions provided with your AVS (after visit summary). The office will not call with normal lab results but certainly if there are any significant abnormalities then we will contact you.   Health Maintenance, Female A healthy lifestyle and preventative care can promote health and wellness.  Maintain regular health, dental, and eye exams.  Eat a healthy diet. Foods like vegetables, fruits, whole grains, low-fat dairy products, and lean protein foods contain the nutrients you need without too many calories. Decrease your intake of foods high in solid fats, added sugars, and salt. Get information about a proper diet from your caregiver, if necessary.  Regular physical exercise is one of the most important things you can do for your health. Most adults should get at least 150 minutes of moderate-intensity exercise (any activity that increases your heart rate and causes you to sweat) each week. In addition, most adults need muscle-strengthening exercises on 2 or more days a week.   Maintain a healthy weight. The body mass index (BMI) is a screening tool to identify possible weight problems. It provides an estimate of body fat based on height and weight. Your caregiver can help determine your BMI, and can help you achieve or maintain a healthy weight. For adults 20 years and older:  A BMI below 18.5 is considered underweight.  A BMI of 18.5 to 24.9 is normal.  A BMI of 25 to 29.9 is considered overweight.  A BMI of 30 and above is considered obese.  Maintain normal blood lipids and cholesterol by exercising and minimizing your intake of saturated fat. Eat a balanced diet with plenty of fruits and vegetables. Blood tests for lipids and cholesterol should begin at age 61 and be repeated every 5 years. If your lipid or cholesterol levels are high, you are over 50, or you are a high risk for heart  disease, you may need your cholesterol levels checked more frequently.Ongoing high lipid and cholesterol levels should be treated with medicines if diet and exercise are not effective.  If you smoke, find out from your caregiver how to quit. If you do not use tobacco, do not start.  Lung cancer screening is recommended for adults aged 33 80 years who are at high risk for developing lung cancer because of a history of smoking. Yearly low-dose computed tomography (CT) is recommended for people who have at least a 30-pack-year history of smoking and are a current smoker or have quit within the past 15 years. A pack year of smoking is smoking an average of 1 pack of cigarettes a day for 1 year (for example: 1 pack a day for 30 years or 2 packs a day for 15 years). Yearly screening should continue until the smoker has stopped smoking for at least 15 years. Yearly screening should also be stopped for people who develop a health problem that would prevent them from having lung cancer treatment.  If you are pregnant, do not drink alcohol. If you are breastfeeding, be very cautious about drinking alcohol. If you are not pregnant and choose to drink alcohol, do not exceed 1 drink per day. One drink is considered to be 12 ounces (355 mL) of beer, 5 ounces (148 mL) of wine, or 1.5 ounces (44 mL) of liquor.  Avoid use of street drugs. Do not share needles with anyone. Ask for help if you need support or instructions about stopping  the use of drugs.  High blood pressure causes heart disease and increases the risk of stroke. Blood pressure should be checked at least every 1 to 2 years. Ongoing high blood pressure should be treated with medicines, if weight loss and exercise are not effective.  If you are 59 to 60 years old, ask your caregiver if you should take aspirin to prevent strokes.  Diabetes screening involves taking a blood sample to check your fasting blood sugar level. This should be done once every 3  years, after age 91, if you are within normal weight and without risk factors for diabetes. Testing should be considered at a younger age or be carried out more frequently if you are overweight and have at least 1 risk factor for diabetes.  Breast cancer screening is essential preventative care for women. You should practice "breast self-awareness." This means understanding the normal appearance and feel of your breasts and may include breast self-examination. Any changes detected, no matter how small, should be reported to a caregiver. Women in their 66s and 30s should have a clinical breast exam (CBE) by a caregiver as part of a regular health exam every 1 to 3 years. After age 101, women should have a CBE every year. Starting at age 100, women should consider having a mammogram (breast X-ray) every year. Women who have a family history of breast cancer should talk to their caregiver about genetic screening. Women at a high risk of breast cancer should talk to their caregiver about having an MRI and a mammogram every year.  Breast cancer gene (BRCA)-related cancer risk assessment is recommended for women who have family members with BRCA-related cancers. BRCA-related cancers include breast, ovarian, tubal, and peritoneal cancers. Having family members with these cancers may be associated with an increased risk for harmful changes (mutations) in the breast cancer genes BRCA1 and BRCA2. Results of the assessment will determine the need for genetic counseling and BRCA1 and BRCA2 testing.  The Pap test is a screening test for cervical cancer. Women should have a Pap test starting at age 57. Between ages 25 and 35, Pap tests should be repeated every 2 years. Beginning at age 37, you should have a Pap test every 3 years as long as the past 3 Pap tests have been normal. If you had a hysterectomy for a problem that was not cancer or a condition that could lead to cancer, then you no longer need Pap tests. If you are  between ages 50 and 76, and you have had normal Pap tests going back 10 years, you no longer need Pap tests. If you have had past treatment for cervical cancer or a condition that could lead to cancer, you need Pap tests and screening for cancer for at least 20 years after your treatment. If Pap tests have been discontinued, risk factors (such as a new sexual partner) need to be reassessed to determine if screening should be resumed. Some women have medical problems that increase the chance of getting cervical cancer. In these cases, your caregiver may recommend more frequent screening and Pap tests.  The human papillomavirus (HPV) test is an additional test that may be used for cervical cancer screening. The HPV test looks for the virus that can cause the cell changes on the cervix. The cells collected during the Pap test can be tested for HPV. The HPV test could be used to screen women aged 44 years and older, and should be used in women of any age  who have unclear Pap test results. After the age of 55, women should have HPV testing at the same frequency as a Pap test.  Colorectal cancer can be detected and often prevented. Most routine colorectal cancer screening begins at the age of 44 and continues through age 20. However, your caregiver may recommend screening at an earlier age if you have risk factors for colon cancer. On a yearly basis, your caregiver may provide home test kits to check for hidden blood in the stool. Use of a small camera at the end of a tube, to directly examine the colon (sigmoidoscopy or colonoscopy), can detect the earliest forms of colorectal cancer. Talk to your caregiver about this at age 86, when routine screening begins. Direct examination of the colon should be repeated every 5 to 10 years through age 13, unless early forms of pre-cancerous polyps or small growths are found.  Hepatitis C blood testing is recommended for all people born from [birth year] through 1967 and any  individual with known risks for hepatitis C.  Practice safe sex. Use condoms and avoid high-risk sexual practices to reduce the spread of sexually transmitted infections (STIs). Sexually active women aged 36 and younger should be checked for Chlamydia, which is a common sexually transmitted infection. Older women with new or multiple partners should also be tested for Chlamydia. Testing for other STIs is recommended if you are sexually active and at increased risk.  Osteoporosis is a disease in which the bones lose minerals and strength with aging. This can result in serious bone fractures. The risk of osteoporosis can be identified using a bone density scan. Women ages 20 and over and women at risk for fractures or osteoporosis should discuss screening with their caregivers. Ask your caregiver whether you should be taking a calcium supplement or vitamin D to reduce the rate of osteoporosis.  Menopause can be associated with physical symptoms and risks. Hormone replacement therapy is available to decrease symptoms and risks. You should talk to your caregiver about whether hormone replacement therapy is right for you.  Use sunscreen. Apply sunscreen liberally and repeatedly throughout the day. You should seek shade when your shadow is shorter than you. Protect yourself by wearing long sleeves, pants, a wide-brimmed hat, and sunglasses year round, whenever you are outdoors.  Notify your caregiver of new moles or changes in moles, especially if there is a change in shape or color. Also notify your caregiver if a mole is larger than the size of a pencil eraser.  Stay current with your immunizations. Document Released: 12/10/2010 Document Revised: 09/21/2012 Document Reviewed: 12/10/2010 [facility] Patient Information 2014 [employer].

## 2014-05-24 NOTE — Progress Notes (Signed)
Kristina Moody [facility] 31-May-1956 [id number]        58 y.o.  [id number] for annual exam.  Several issues noted below.  Past medical history,surgical history, problem list, medications, allergies, family history and social history were all reviewed and documented as reviewed in the EPIC chart.  ROS:  12 system ROS performed with pertinent positives and negatives included in the history, assessment and plan.   Additional significant findings :  none   Exam: [name] Vitals:   05/24/14 1036  BP: 120/68  Height: 5\' 5"  (1.651 m)  Weight: 123 lb (55.792 kg)   General appearance:  Normal affect, orientation and appearance. Skin: Grossly normal HEENT: Without gross lesions.  No cervical or supraclavicular adenopathy. Thyroid normal.  Lungs:  Clear without wheezing, rales or rhonchi Cardiac: RR, without RMG Abdominal:  Soft, nontender, without masses, guarding, rebound, organomegaly or hernia Breasts:  Examined lying and sitting without masses, retractions, discharge or axillary adenopathy. Pelvic:  Ext/BUS/vagina normal with mild atrophic changes  Cervix normal with atrophic changes  Uterus anteverted, normal size, shape and contour, midline and mobile nontender   Adnexa  Without masses or tenderness    Anus and perineum  Normal   Rectovaginal  Normal sphincter tone without palpated masses or tenderness.    Assessment/Plan:  58 y.o. [id number] female for annual exam.   1. Postmenopausal/HRT.  Continues on estradiol 1 mg daily and Prometrium 200 mg 13 days each month. No bleeding after the Prometrium. I again reviewed the whole issue of HRT to include the WHI study with increased risk of stroke heart attack DVT and breast cancer. The issues as to when to wean as well as the timing hypotheses as far as starting HRT early and possible long-term benefits. After lengthy discussion the patient wants to continue and I refilled her medications 1 year. She uses occasional Premarin vaginal cream  externally when she has extra dryness but does not do this very often. She has a supply at home and will call if she needs more. 2. Osteopenia. DEXA 05/2013 T score -1.4 FRAX 13%/0.6%. Increased calcium and vitamin D reviewed. Repeat DEXA in the next several years. 3. Pap smear/HPV 2013. No Pap smear done today. 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 Repeat at 3-5 year interval for current screening guidelines. 4. Mammography 01/2014. Continue with annual mammography. SBE monthly reviewed. 5. Colonoscopy 2014. Repeat at their recommended interval. 6. Health maintenance. No routine blood work done as she has this done at her primary physician's office. Follow up 1 year, sooner as needed.     [name] MD, 11:22 AM 05/24/2014

## 2014-05-25 LAB — URINALYSIS W MICROSCOPIC + REFLEX CULTURE
Bacteria, UA: NONE SEEN
Bilirubin Urine: NEGATIVE
CASTS: NONE SEEN
Crystals: NONE SEEN
GLUCOSE, UA: NEGATIVE mg/dL
Hgb urine dipstick: NEGATIVE
Ketones, ur: NEGATIVE mg/dL
LEUKOCYTES UA: NEGATIVE
Nitrite: NEGATIVE
PH: 6 (ref 5.0–8.0)
Protein, ur: NEGATIVE mg/dL
SQUAMOUS EPITHELIAL / LPF: NONE SEEN
Urobilinogen, UA: 0.2 mg/dL (ref 0.0–1.0)

## 2014-09-14 ENCOUNTER — Telehealth: Payer: Self-pay | Admitting: *Deleted

## 2014-09-14 MED ORDER — VALACYCLOVIR HCL 500 MG PO TABS: ORAL_TABLET | ORAL | Status: DC

## 2014-09-14 NOTE — Telephone Encounter (Signed)
Valtrex 500 mg twice a day 5 days #20 refill 1

## 2014-09-14 NOTE — Telephone Encounter (Signed)
Rx sent, pt informed. 

## 2014-09-14 NOTE — Telephone Encounter (Signed)
Pt is requesting a new Rx for valacyclovir or Zovirax for HSV outbreak. Pt said her old Rx has expired, last outbreak was 4 years ago. Please advise

## 2014-11-16 ENCOUNTER — Other Ambulatory Visit: Payer: Self-pay | Admitting: Family Medicine

## 2014-11-16 ENCOUNTER — Ambulatory Visit: Payer: No Typology Code available for payment source

## 2014-11-16 ENCOUNTER — Ambulatory Visit
Admission: RE | Admit: 2014-11-16 | Discharge: 2014-11-16 | Disposition: A | Discharge status: Home / Self Care | Payer: No Typology Code available for payment source | Source: Ambulatory Visit | Attending: Family Medicine | Admitting: Family Medicine

## 2014-11-16 DIAGNOSIS — R059 Cough, unspecified: Secondary | ICD-10-CM

## 2014-11-16 DIAGNOSIS — R05 Cough: Secondary | ICD-10-CM

## 2015-01-06 ENCOUNTER — Other Ambulatory Visit: Payer: Self-pay | Admitting: Gynecology

## 2015-01-06 DIAGNOSIS — Z1231 Encounter for screening mammogram for malignant neoplasm of breast: Secondary | ICD-10-CM

## 2015-01-19 ENCOUNTER — Ambulatory Visit: Payer: No Typology Code available for payment source | Admitting: Podiatry

## 2015-02-06 ENCOUNTER — Ambulatory Visit (HOSPITAL_COMMUNITY)
Admission: RE | Admit: 2015-02-06 | Discharge: 2015-02-06 | Disposition: A | Discharge status: Home / Self Care | Payer: No Typology Code available for payment source | Source: Ambulatory Visit | Attending: Gynecology | Admitting: Gynecology

## 2015-02-06 DIAGNOSIS — Z1231 Encounter for screening mammogram for malignant neoplasm of breast: Secondary | ICD-10-CM | POA: Insufficient documentation

## 2015-05-27 ENCOUNTER — Other Ambulatory Visit: Payer: Self-pay | Admitting: Gynecology

## 2015-06-03 ENCOUNTER — Ambulatory Visit (INDEPENDENT_AMBULATORY_CARE_PROVIDER_SITE_OTHER): Payer: No Typology Code available for payment source | Admitting: Physician Assistant

## 2015-06-03 ENCOUNTER — Ambulatory Visit (INDEPENDENT_AMBULATORY_CARE_PROVIDER_SITE_OTHER): Payer: No Typology Code available for payment source

## 2015-06-03 VITALS — BP 112/70 | HR 86 | Temp 98.1°F | Resp 14 | Ht 65.5 in | Wt 128.4 lb

## 2015-06-03 DIAGNOSIS — S93402A Sprain of unspecified ligament of left ankle, initial encounter: Secondary | ICD-10-CM | POA: Diagnosis not present

## 2015-06-03 DIAGNOSIS — M25572 Pain in left ankle and joints of left foot: Secondary | ICD-10-CM

## 2015-06-03 NOTE — Progress Notes (Signed)
06/05/2015 9:19 PM   DOB: [birth year] / MRN: [id number]  SUBJECTIVE:  Ankle Pain  The incident occurred 12 to 24 hours ago. The incident occurred in the street. The injury mechanism was an inversion injury and a twisting injury. The pain is present in the left ankle. The quality of the pain is described as stabbing. The pain is moderate. Pertinent negatives include no inability to bear weight, loss of motion, loss of sensation, muscle weakness, numbness or tingling. Associated symptoms comments: Swelling and bruising. She reports no foreign bodies present. The symptoms are aggravated by movement. She has tried ice and rest for the symptoms. The treatment provided mild relief.     She is allergic to codeine.   She  has a past medical history of Elevated cholesterol; HSV (herpes simplex virus) infection; Osteopenia (05/2013); History of colposcopy with cervical biopsy (2004, 2005); gestational diabetes; and Hypertension.    She  reports that she has quit smoking. She has never used smokeless tobacco. She reports that she drinks about 3.0 oz of alcohol per week. She reports that she does not use illicit drugs. She  reports that she currently engages in sexual activity and has had female partners. She reports using the following method of birth control/protection: Post-menopausal. The patient  has past surgical history that includes Cosmetic surgery; Skin surgery; Cesarean section (06/15/1992); and Colposcopy.  Her family history includes Cancer in her father; Dementia in her mother; Diabetes in her mother; Heart disease in her father and mother; Hypertension in her father and mother.  Review of Systems  Constitutional: Negative for fever.  Musculoskeletal: Positive for joint pain and falls.  Skin: Negative for rash.  Neurological: Negative for dizziness, tingling, sensory change and numbness.    Problem list and medications reviewed and updated by myself where necessary, and exist elsewhere in the  encounter.   OBJECTIVE:  BP 112/70 mmHg  Pulse 86  Temp(Src) 98.1 F (36.7 C) (Oral)  Resp 14  Ht 5' 5.5" (1.664 m)  Wt 128 lb 6.4 oz (58.242 kg)  BMI 21.03 kg/m2  SpO2 97%  LMP 09/11/2008  Physical Exam  Constitutional: She is oriented to person, place, and time. She appears well-nourished. No distress.  Eyes: EOM are normal. Pupils are equal, round, and reactive to light.  Cardiovascular: Normal rate.   Pulmonary/Chest: Effort normal.  Abdominal: She exhibits no distension.  Musculoskeletal:       Right ankle: Normal.       Left ankle: She exhibits ecchymosis. She exhibits normal range of motion, no deformity and normal pulse. Tenderness. Lateral malleolus and AITFL tenderness found. No CF ligament, no posterior TFL, no head of 5th metatarsal and no proximal fibula tenderness found. Achilles tendon normal.  Neurological: She is alert and oriented to person, place, and time. No cranial nerve deficit. Gait normal.  Skin: Skin is dry. She is not diaphoretic.  Psychiatric: She has a normal mood and affect.  Vitals reviewed.   No results found for this or any previous visit (from the past 48 hour(s)).  ASSESSMENT AND PLAN  Kristina Moody was seen today for ankle pain.  Diagnoses and all orders for this visit:  Left ankle pain: Rads negative for fracture.   -     DG Ankle Complete Left; Future  Sprain of ankle, left, initial encounter: Ace bandage provided. Advised 600-800 mg Ibuprofen for pain, Ice for swelling. Advised patient to allow two to three weeks for full recovery.      The patient  was advised to call or return to clinic if she does not see an improvement in symptoms or to seek the care of the closest emergency department if she worsens with the above plan.   Philis Fendt, MHS, PA-C [facility] 06/05/2015 9:19 PM

## 2015-06-03 NOTE — Patient Instructions (Signed)
Elastic Bandage and RICE °WHAT DOES AN ELASTIC BANDAGE DO? °Elastic bandages come in different shapes and sizes. They generally provide support to your injury and reduce swelling while you are healing, but they can perform different functions. Your health care provider will help you to decide what is best for your protection, recovery, or rehabilitation following an injury. °WHAT ARE SOME GENERAL TIPS FOR USING AN ELASTIC BANDAGE? °· Use the bandage as directed by the maker of the bandage that you are using. °· Do not wrap the bandage too tightly. This may cut off the circulation in the arm or leg in the area below the bandage. °¨ If part of your body beyond the bandage becomes blue, numb, cold, swollen, or is more painful, your bandage is most likely too tight. If this occurs, remove your bandage and reapply it more loosely. °· See your health care provider if the bandage seems to be making your problems worse rather than better. °· An elastic bandage should be removed and reapplied every 3-4 hours or as directed by your health care provider. °WHAT IS RICE? °The routine care of many injuries includes rest, ice, compression, and elevation (RICE therapy).  °Rest °Rest is required to allow your body to heal. Generally, you can resume your routine activities when you are comfortable and have been given permission by your health care provider. °Ice °Icing your injury helps to keep the swelling down and it reduces pain. Do not apply ice directly to your skin. °· Put ice in a plastic bag. °· Place a towel between your skin and the bag. °· Leave the ice on for 20 minutes, 2-3 times per day. °Do this for as long as you are directed by your health care provider. °Compression °Compression helps to keep swelling down, gives support, and helps with discomfort. Compression may be done with an elastic bandage. °Elevation °Elevation helps to reduce swelling and it decreases pain. If possible, your injured area should be placed at  or above the level of your heart or the center of your chest. °WHEN SHOULD I SEEK MEDICAL CARE? °You should seek medical care if: °· You have persistent pain and swelling. °· Your symptoms are getting worse rather than improving. °These symptoms may indicate that further evaluation or further X-rays are needed. Sometimes, X-rays may not show a small broken bone (fracture) until a number of days later. Make a follow-up appointment with your health care provider. Ask when your X-ray results will be ready. Make sure that you get your X-ray results. °WHEN SHOULD I SEEK IMMEDIATE MEDICAL CARE? °You should seek immediate medical care if: °· You have a sudden onset of severe pain at or below the area of your injury. °· You develop redness or increased swelling around your injury. °· You have tingling or numbness at or below the area of your injury that does not improve after you remove the elastic bandage. °  °This information is not intended to replace advice given to you by your health care provider. Make sure you discuss any questions you have with your health care provider. °  °Document Released: 11/16/2001 Document Revised: 02/15/2015 Document Reviewed: 01/10/2014 °Elsevier Interactive Patient Education ©2016 [employer] ° °

## 2015-06-15 ENCOUNTER — Encounter: Payer: Self-pay | Admitting: Gynecology

## 2015-06-15 ENCOUNTER — Ambulatory Visit (INDEPENDENT_AMBULATORY_CARE_PROVIDER_SITE_OTHER): Payer: No Typology Code available for payment source | Admitting: Gynecology

## 2015-06-15 VITALS — BP 122/76 | Ht 65.0 in | Wt 128.0 lb

## 2015-06-15 DIAGNOSIS — Z79890 Hormone replacement therapy (postmenopausal): Secondary | ICD-10-CM

## 2015-06-15 DIAGNOSIS — N952 Postmenopausal atrophic vaginitis: Secondary | ICD-10-CM

## 2015-06-15 DIAGNOSIS — Z01419 Encounter for gynecological examination (general) (routine) without abnormal findings: Secondary | ICD-10-CM | POA: Diagnosis not present

## 2015-06-15 DIAGNOSIS — M8580 Other specified disorders of bone density and structure, unspecified site: Secondary | ICD-10-CM

## 2015-06-15 MED ORDER — PROGESTERONE MICRONIZED 100 MG PO CAPS: 100.0000 mg | ORAL_CAPSULE | Freq: Every day | ORAL | Status: DC

## 2015-06-15 MED ORDER — ESTRADIOL 1 MG PO TABS: ORAL_TABLET | ORAL | Status: DC

## 2015-06-15 NOTE — Progress Notes (Signed)
Danasha Enloe [facility] Dec 17, 1955 [id number]        60 y.o.  [id number]  for annual exam.  Several issues noted below.  Past medical history,surgical history, problem list, medications, allergies, family history and social history were all reviewed and documented as reviewed in the EPIC chart.  ROS:  Performed with pertinent positives and negatives included in the history, assessment and plan.   Additional significant findings :  none   Exam: [name] assistant Filed Vitals:   06/15/15 1154  BP: 122/76  Height: 5\' 5"  (1.651 m)  Weight: 128 lb (58.06 kg)   General appearance:  Normal affect, orientation and appearance. Skin: Grossly normal HEENT: Without gross lesions.  No cervical or supraclavicular adenopathy. Thyroid normal.  Lungs:  Clear without wheezing, rales or rhonchi Cardiac: RR, without RMG Abdominal:  Soft, nontender, without masses, guarding, rebound, organomegaly or hernia Breasts:  Examined lying and sitting without masses, retractions, discharge or axillary adenopathy. Pelvic:  Ext/BUS/vagina with atrophic changes  Cervix with atrophic changes  Uterus anteverted, normal size, shape and contour, midline and mobile nontender   Adnexa  Without masses or tenderness    Anus and perineum  Normal   Rectovaginal  Normal sphincter tone without palpated masses or tenderness.    Assessment/Plan:  60 y.o. [id number] female for annual exam.   1. Postmenopausal/atrophic genital changes/HRT. Patient continues on Estrace 1 mg daily and Prometrium 200 mg first 13 days each month with no withdrawal bleed. Had been using Premarin vaginal cream but admits not using it. I again reviewed the whole issue of HRT, risks versus benefits increased, stroke heart attack DVT and breast cancer issues. Patient's comfortable continuing. I did switch her from Prometrium 200 mg to the Prometrium 100 mg nightly along with her Estrace 1 mg daily. Patient knows to call if she does any vaginal bleeding. Refill 1  year provided. 2. Osteopenia. DEXA 05/2013 T score -1.4 FRAX 13%/0.6%. Repeat DEXA now a 2 year interval and patient will schedule. Check baseline vitamin D level today. She is taking extra vitamin D and calcium. 3. Mammography 01/2015. Continue with annual mammography when due. SBE monthly reviewed. 4. Colonoscopy 2015. Repeat at their recommended interval. 5. Pap smear/HPV negative 2013.  No Pap smear done today. 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 Plan repeat Pap smear next year 6. Health maintenance. No routine blood work done as patient reports this done at her primary physician's office. Follow up for bone density otherwise 1 year, sooner as needed.   [name] MD, 12:26 PM 06/15/2015

## 2015-06-15 NOTE — Patient Instructions (Signed)
Follow up for bone density as scheduled and vitamin D results.  You may obtain a copy of any labs that were done today by logging onto MyChart as outlined in the instructions provided with your AVS (after visit summary). The office will not call with normal lab results but certainly if there are any significant abnormalities then we will contact you.   Health Maintenance Adopting a healthy lifestyle and getting preventive care can go a long way to promote health and wellness. Talk with your health care provider about what schedule of regular examinations is right for you. This is a good chance for you to check in with your provider about disease prevention and staying healthy. In between checkups, there are plenty of things you can do on your own. Experts have done a lot of research about which lifestyle changes and preventive measures are most likely to keep you healthy. Ask your health care provider for more information. WEIGHT AND DIET  Eat a healthy diet  Be sure to include plenty of vegetables, fruits, low-fat dairy products, and lean protein.  Do not eat a lot of foods high in solid fats, added sugars, or salt.  Get regular exercise. This is one of the most important things you can do for your health.  Most adults should exercise for at least 150 minutes each week. The exercise should increase your heart rate and make you sweat (moderate-intensity exercise).  Most adults should also do strengthening exercises at least twice a week. This is in addition to the moderate-intensity exercise.  Maintain a healthy weight  Body mass index (BMI) is a measurement that can be used to identify possible weight problems. It estimates body fat based on height and weight. Your health care provider can help determine your BMI and help you achieve or maintain a healthy weight.  For females 62 years of age and older:   A BMI below 18.5 is considered underweight.  A BMI of 18.5 to 24.9 is normal.  A  BMI of 25 to 29.9 is considered overweight.  A BMI of 30 and above is considered obese.  Watch levels of cholesterol and blood lipids  You should start having your blood tested for lipids and cholesterol at 62 years of age, then have this test every 5 years.  You may need to have your cholesterol levels checked more often if:  Your lipid or cholesterol levels are high.  You are older than 62 years of age.  You are at high risk for heart disease.  CANCER SCREENING   Lung Cancer  Lung cancer screening is recommended for adults 81-60 years old who are at high risk for lung cancer because of a history of smoking.  A yearly low-dose CT scan of the lungs is recommended for people who:  Currently smoke.  Have quit within the past 15 years.  Have at least a 30-pack-year history of smoking. A pack year is smoking an average of one pack of cigarettes a day for 1 year.  Yearly screening should continue until it has been 15 years since you quit.  Yearly screening should stop if you develop a health problem that would prevent you from having lung cancer treatment.  Breast Cancer  Practice breast self-awareness. This means understanding how your breasts normally appear and feel.  It also means doing regular breast self-exams. Let your health care provider know about any changes, no matter how small.  If you are in your 20s or 30s, you should have  CBE) by a health care provider every 1-3 years as part of a regular health exam.  If you are 40 or older, have a CBE every year. Also consider having a breast X-ray (mammogram) every year.  If you have a family history of breast cancer, talk to your health care provider about genetic screening.  If you are at high risk for breast cancer, talk to your health care provider about having an MRI and a mammogram every year.  Breast cancer gene (BRCA) assessment is recommended for women who have family members with BRCA-related  cancers. BRCA-related cancers include:  Breast.  Ovarian.  Tubal.  Peritoneal cancers.  Results of the assessment will determine the need for genetic counseling and BRCA1 and BRCA2 testing. Cervical Cancer Routine pelvic examinations to screen for cervical cancer are no longer recommended for nonpregnant women who are considered low risk for cancer of the pelvic organs (ovaries, uterus, and vagina) and who do not have symptoms. A pelvic examination may be necessary if you have symptoms including those associated with pelvic infections. Ask your health care provider if a screening pelvic exam is right for you.   The Pap test is the screening test for cervical cancer for women who are considered at risk.  If you had a hysterectomy for a problem that was not cancer or a condition that could lead to cancer, then you no longer need Pap tests.  If you are older than 65 years, and you have had normal Pap tests for the past 10 years, you no longer need to have Pap tests.  If you have had past treatment for cervical cancer or a condition that could lead to cancer, you need Pap tests and screening for cancer for at least 20 years after your treatment.  If you no longer get a Pap test, assess your risk factors if they change (such as having a new sexual partner). This can affect whether you should start being screened again.  Some women have medical problems that increase their chance of getting cervical cancer. If this is the case for you, your health care provider may recommend more frequent screening and Pap tests.  The human papillomavirus (HPV) test is another test that may be used for cervical cancer screening. The HPV test looks for the virus that can cause cell changes in the cervix. The cells collected during the Pap test can be tested for HPV.  The HPV test can be used to screen women 30 years of age and older. Getting tested for HPV can extend the interval between normal Pap tests from  three to five years.  An HPV test also should be used to screen women of any age who have unclear Pap test results.  After 62 years of age, women should have HPV testing as often as Pap tests.  Colorectal Cancer  This type of cancer can be detected and often prevented.  Routine colorectal cancer screening usually begins at 62 years of age and continues through 62 years of age.  Your health care provider may recommend screening at an earlier age if you have risk factors for colon cancer.  Your health care provider may also recommend using home test kits to check for hidden blood in the stool.  A small camera at the end of a tube can be used to examine your colon directly (sigmoidoscopy or colonoscopy). This is done to check for the earliest forms of colorectal cancer.  Routine screening usually begins at age 50.    Direct examination of the colon should be repeated every 5-10 years through 62 years of age. However, you may need to be screened more often if early forms of precancerous polyps or small growths are found. Skin Cancer  Check your skin from head to toe regularly.  Tell your health care provider about any new moles or changes in moles, especially if there is a change in a mole's shape or color.  Also tell your health care provider if you have a mole that is larger than the size of a pencil eraser.  Always use sunscreen. Apply sunscreen liberally and repeatedly throughout the day.  Protect yourself by wearing long sleeves, pants, a wide-brimmed hat, and sunglasses whenever you are outside. HEART DISEASE, DIABETES, AND HIGH BLOOD PRESSURE   Have your blood pressure checked at least every 1-2 years. High blood pressure causes heart disease and increases the risk of stroke.  If you are between 55 years and 79 years old, ask your health care provider if you should take aspirin to prevent strokes.  Have regular diabetes screenings. This involves taking a blood sample to check  your fasting blood sugar level.  If you are at a normal weight and have a low risk for diabetes, have this test once every three years after 62 years of age.  If you are overweight and have a high risk for diabetes, consider being tested at a younger age or more often. PREVENTING INFECTION  Hepatitis B  If you have a higher risk for hepatitis B, you should be screened for this virus. You are considered at high risk for hepatitis B if:  You were born in a country where hepatitis B is common. Ask your health care provider which countries are considered high risk.  Your parents were born in a high-risk country, and you have not been immunized against hepatitis B (hepatitis B vaccine).  You have HIV or AIDS.  You use needles to inject street drugs.  You live with someone who has hepatitis B.  You have had sex with someone who has hepatitis B.  You get hemodialysis treatment.  You take certain medicines for conditions, including cancer, organ transplantation, and autoimmune conditions. Hepatitis C  Blood testing is recommended for:  Everyone born from 1945 through 1965.  Anyone with known risk factors for hepatitis C. Sexually transmitted infections (STIs)  You should be screened for sexually transmitted infections (STIs) including gonorrhea and chlamydia if:  You are sexually active and are younger than 62 years of age.  You are older than 62 years of age and your health care provider tells you that you are at risk for this type of infection.  Your sexual activity has changed since you were last screened and you are at an increased risk for chlamydia or gonorrhea. Ask your health care provider if you are at risk.  If you do not have HIV, but are at risk, it may be recommended that you take a prescription medicine daily to prevent HIV infection. This is called pre-exposure prophylaxis (PrEP). You are considered at risk if:  You are sexually active and do not regularly use  condoms or know the HIV status of your partner(s).  You take drugs by injection.  You are sexually active with a partner who has HIV. Talk with your health care provider about whether you are at high risk of being infected with HIV. If you choose to begin PrEP, you should first be tested for HIV. You should then be   tested every 3 months for as long as you are taking PrEP.  PREGNANCY   If you are premenopausal and you may become pregnant, ask your health care provider about preconception counseling.  If you may become pregnant, take 400 to 800 micrograms (mcg) of folic acid every day.  If you want to prevent pregnancy, talk to your health care provider about birth control (contraception). OSTEOPOROSIS AND MENOPAUSE   Osteoporosis is a disease in which the bones lose minerals and strength with aging. This can result in serious bone fractures. Your risk for osteoporosis can be identified using a bone density scan.  If you are 65 years of age or older, or if you are at risk for osteoporosis and fractures, ask your health care provider if you should be screened.  Ask your health care provider whether you should take a calcium or vitamin D supplement to lower your risk for osteoporosis.  Menopause may have certain physical symptoms and risks.  Hormone replacement therapy may reduce some of these symptoms and risks. Talk to your health care provider about whether hormone replacement therapy is right for you.  HOME CARE INSTRUCTIONS   Schedule regular health, dental, and eye exams.  Stay current with your immunizations.   Do not use any tobacco products including cigarettes, chewing tobacco, or electronic cigarettes.  If you are pregnant, do not drink alcohol.  If you are breastfeeding, limit how much and how often you drink alcohol.  Limit alcohol intake to no more than 1 drink per day for nonpregnant women. One drink equals 12 ounces of beer, 5 ounces of wine, or 1 ounces of hard  liquor.  Do not use street drugs.  Do not share needles.  Ask your health care provider for help if you need support or information about quitting drugs.  Tell your health care provider if you often feel depressed.  Tell your health care provider if you have ever been abused or do not feel safe at home. Document Released: 12/10/2010 Document Revised: 10/11/2013 Document Reviewed: 04/28/2013 Exit[employer] Patient Information 2015 [employer]. This information is not intended to replace advice given to you by your health care provider. Make sure you discuss any questions you have with your health care provider.  

## 2015-06-16 LAB — URINALYSIS W MICROSCOPIC + REFLEX CULTURE
BILIRUBIN URINE: NEGATIVE
Bacteria, UA: NONE SEEN [HPF]
Casts: NONE SEEN [LPF]
Crystals: NONE SEEN [HPF]
GLUCOSE, UA: NEGATIVE
Hgb urine dipstick: NEGATIVE
KETONES UR: NEGATIVE
LEUKOCYTES UA: NEGATIVE
Nitrite: NEGATIVE
PH: 6 (ref 5.0–8.0)
Protein, ur: NEGATIVE
RBC / HPF: NONE SEEN RBC/HPF (ref <=2)
SPECIFIC GRAVITY, URINE: 1.006 (ref 1.001–1.035)
WBC UA: NONE SEEN WBC/HPF (ref <=5)
Yeast: NONE SEEN [HPF]

## 2015-06-16 LAB — VITAMIN D 25 HYDROXY (VIT D DEFICIENCY, FRACTURES): VIT D 25 HYDROXY: 23 ng/mL — AB (ref 30–100)

## 2015-07-12 DIAGNOSIS — M8580 Other specified disorders of bone density and structure, unspecified site: Secondary | ICD-10-CM

## 2015-07-12 HISTORY — DX: Other specified disorders of bone density and structure, unspecified site: M85.80

## 2015-07-25 ENCOUNTER — Ambulatory Visit (INDEPENDENT_AMBULATORY_CARE_PROVIDER_SITE_OTHER): Payer: No Typology Code available for payment source

## 2015-07-25 ENCOUNTER — Encounter: Payer: Self-pay | Admitting: Gynecology

## 2015-07-25 ENCOUNTER — Other Ambulatory Visit: Payer: Self-pay | Admitting: Gynecology

## 2015-07-25 DIAGNOSIS — Z13820 Encounter for screening for osteoporosis: Secondary | ICD-10-CM | POA: Diagnosis not present

## 2015-07-25 DIAGNOSIS — M8580 Other specified disorders of bone density and structure, unspecified site: Secondary | ICD-10-CM

## 2015-07-25 DIAGNOSIS — M899 Disorder of bone, unspecified: Secondary | ICD-10-CM

## 2016-01-01 ENCOUNTER — Other Ambulatory Visit: Payer: Self-pay | Admitting: Gynecology

## 2016-01-01 DIAGNOSIS — Z1231 Encounter for screening mammogram for malignant neoplasm of breast: Secondary | ICD-10-CM

## 2016-02-07 ENCOUNTER — Ambulatory Visit
Admission: RE | Admit: 2016-02-07 | Discharge: 2016-02-07 | Disposition: A | Discharge status: Home / Self Care | Payer: No Typology Code available for payment source | Source: Ambulatory Visit | Attending: Gynecology | Admitting: Gynecology

## 2016-02-07 DIAGNOSIS — Z1231 Encounter for screening mammogram for malignant neoplasm of breast: Secondary | ICD-10-CM

## 2016-04-08 ENCOUNTER — Ambulatory Visit (INDEPENDENT_AMBULATORY_CARE_PROVIDER_SITE_OTHER): Payer: No Typology Code available for payment source | Admitting: Gynecology

## 2016-04-08 ENCOUNTER — Encounter: Payer: Self-pay | Admitting: Gynecology

## 2016-04-08 VITALS — BP 118/70

## 2016-04-08 DIAGNOSIS — R21 Rash and other nonspecific skin eruption: Secondary | ICD-10-CM | POA: Diagnosis not present

## 2016-04-08 DIAGNOSIS — N898 Other specified noninflammatory disorders of vagina: Secondary | ICD-10-CM

## 2016-04-08 MED ORDER — NYSTATIN-TRIAMCINOLONE 100000-0.1 UNIT/GM-% EX OINT: 1.0000 "application " | TOPICAL_OINTMENT | Freq: Two times a day (BID) | CUTANEOUS | 0 refills | Status: DC

## 2016-04-08 NOTE — Progress Notes (Signed)
    Fransheska Ledbetter [facility] [birth year] [id number]        60 y.o.  [id number] presents having just [place] complaining of 2 issues:  1. Perianal rash that she is attributing to some diarrhea that she is having with a lot of irritation and itching. 2. A lump felt at the outer part of the vagina that she never felt before. Not tender but just bothersome now that she knows it is here. No history of same before.  Past medical history,surgical history, problem list, medications, allergies, family history and social history were all reviewed and documented in the EPIC chart.  Directed ROS with pertinent positives and negatives documented in the history of present illness/assessment and plan.  Exam: [name] assistant Vitals:   04/08/16 1515  BP: 118/70   General appearance:  Normal Abdomen soft nontender without masses guarding rebound Pelvic external BUS vagina with 1.5 cm submucosal cyst left of posterior fourchette just outside the hymenal ring. No vaginal lesions. Cervix normal. Uterus grossly normal size midline mobile nontender. Adnexa without masses or tenderness. Perirectal exam shows inflammatory rash symmetrically around the anal opening. No lesions noted on exam  Assessment/Plan:  60 y.o. [id number] with:  1. Perianal rash questionable secondary to diarrhea and a chemical skin irritant versus possible fungal. Will treat with Mytrex 3 times daily. Will call if persists despite this. 2. Small vaginal cyst at the vaginal opening. Benign in appearance. Options for management to include incision, excision and observation reviewed. At this point recommend observation for the next several weeks. If persists or certainly enlarges she'll represent to have it excised. If it resolves spontaneously then she'll follow. She is due for her annual in January regardless and I reminded her to follow up for this.    [name] MD, 3:37 PM 04/08/2016

## 2016-04-08 NOTE — Patient Instructions (Signed)
Use the prescribed cream in the perianal region 3 times daily. Call if this area continues to be irritated. Follow up if the vaginal cyst persists or enlarges.

## 2016-04-17 ENCOUNTER — Ambulatory Visit (INDEPENDENT_AMBULATORY_CARE_PROVIDER_SITE_OTHER): Payer: BLUE CROSS/BLUE SHIELD | Admitting: Gynecology

## 2016-04-17 ENCOUNTER — Encounter: Payer: Self-pay | Admitting: Gynecology

## 2016-04-17 VITALS — BP 122/74

## 2016-04-17 DIAGNOSIS — N907 Vulvar cyst: Secondary | ICD-10-CM

## 2016-04-17 NOTE — Progress Notes (Signed)
    Kristina Moody [facility] [birth year] [id number]        60 y.o.  [id number] presents to have her vulvar cyst excised. Is becoming more bothersome and irritative to her.  Past medical history,surgical history, problem list, medications, allergies, family history and social history were all reviewed and documented in the EPIC chart.  Directed ROS with pertinent positives and negatives documented in the history of present illness/assessment and plan.  Exam: [name] assistant Vitals:   04/17/16 0841  BP: 122/74   General appearance:  Normal External BUS vagina with 1.5 cm vulvar cyst left posterior fourchette. Physical Exam  Genitourinary:      Procedure: The skin overlying and surrounding the cyst was cleansed with Betadine and infiltrated with 1% lidocaine. The skin was sharply incised and the cyst was entered. The upper third of the cyst was excised. Due to the need for a lot more dissection to remove the remaining cyst wall was decided to stop at this point and allow the cyst to deflate and granulate in as a marsupialization type procedure. The cyst wall with overlying skin excision was sent to pathology. Silver nitrate was applied for hemostasis. Postoperative instructions given.  Assessment/Plan:  60 y.o. [id number] with benign-appearing vulvar cyst is becoming irritated the patient. Cyst was unroofed with portion of wall sent to pathology. Patient will follow up if it continues to be an issue but I reviewed with her that I think it will granulate in and resolve itself.    [name] MD, 9:11 AM 04/17/2016

## 2016-04-17 NOTE — Patient Instructions (Signed)
Office will call you with biopsy results 

## 2016-04-18 DIAGNOSIS — H5310 Unspecified subjective visual disturbances: Secondary | ICD-10-CM | POA: Diagnosis not present

## 2016-04-18 LAB — PATHOLOGY

## 2016-06-23 ENCOUNTER — Other Ambulatory Visit: Payer: Self-pay | Admitting: Gynecology

## 2016-07-23 DIAGNOSIS — J343 Hypertrophy of nasal turbinates: Secondary | ICD-10-CM | POA: Diagnosis not present

## 2016-07-23 DIAGNOSIS — J342 Deviated nasal septum: Secondary | ICD-10-CM | POA: Diagnosis not present

## 2016-07-23 DIAGNOSIS — J310 Chronic rhinitis: Secondary | ICD-10-CM | POA: Insufficient documentation

## 2016-07-23 DIAGNOSIS — R0683 Snoring: Secondary | ICD-10-CM | POA: Diagnosis not present

## 2016-07-24 ENCOUNTER — Encounter: Payer: Self-pay | Admitting: Gynecology

## 2016-07-24 ENCOUNTER — Ambulatory Visit (INDEPENDENT_AMBULATORY_CARE_PROVIDER_SITE_OTHER): Payer: BLUE CROSS/BLUE SHIELD | Admitting: Gynecology

## 2016-07-24 VITALS — BP 124/70 | Ht 65.0 in | Wt 134.0 lb

## 2016-07-24 DIAGNOSIS — Z79890 Hormone replacement therapy (postmenopausal): Secondary | ICD-10-CM | POA: Diagnosis not present

## 2016-07-24 DIAGNOSIS — N952 Postmenopausal atrophic vaginitis: Secondary | ICD-10-CM

## 2016-07-24 DIAGNOSIS — Z1151 Encounter for screening for human papillomavirus (HPV): Secondary | ICD-10-CM

## 2016-07-24 DIAGNOSIS — Z01411 Encounter for gynecological examination (general) (routine) with abnormal findings: Secondary | ICD-10-CM | POA: Diagnosis not present

## 2016-07-24 MED ORDER — ESTRADIOL 1 MG PO TABS: 1.0000 mg | ORAL_TABLET | Freq: Every day | ORAL | 12 refills | Status: DC

## 2016-07-24 MED ORDER — PROGESTERONE MICRONIZED 100 MG PO CAPS: 100.0000 mg | ORAL_CAPSULE | Freq: Every day | ORAL | 12 refills | Status: DC

## 2016-07-24 NOTE — Patient Instructions (Signed)

## 2016-07-24 NOTE — Progress Notes (Signed)
    Kristina Moody [facility] 1955/12/19 [id number]        61 y.o.  [id number] for annual exam.    Past medical history,surgical history, problem list, medications, allergies, family history and social history were all reviewed and documented as reviewed in the EPIC chart.  ROS:  Performed with pertinent positives and negatives included in the history, assessment and plan.   Additional significant findings :  None   Exam: Kristina Moody assistant Vitals:   07/24/16 1456  BP: 124/70  Weight: 134 lb (60.8 kg)  Height: 5\' 5"  (1.651 m)   Body mass index is 22.3 kg/m.  General appearance:  Normal affect, orientation and appearance. Skin: Grossly normal HEENT: Without gross lesions.  No cervical or supraclavicular adenopathy. Thyroid normal.  Lungs:  Clear without wheezing, rales or rhonchi Cardiac: RR, without RMG Abdominal:  Soft, nontender, without masses, guarding, rebound, organomegaly or hernia Breasts:  Examined lying and sitting without masses, retractions, discharge or axillary adenopathy. Pelvic:  Ext, BUS, Vagina with atrophic changes  Cervix with atrophic changes  Uterus anteverted, normal size, shape and contour, midline and mobile nontender   Adnexa without masses or tenderness    Anus and perineum normal   Rectovaginal normal sphincter tone without palpated masses or tenderness.    Assessment/Plan:  61 y.o. [id number] female for annual exam.   1. Postmenopausal/atrophic genital changes/HRT. Continues on estradiol 1 mg and Prometrium 100 mg at bedtime. Doing well with no bleeding. Reviewed current state of HRT to include the most recent 2017 names guidelines. Benefits to include symptom relief possible cardiovascular/bone health with early initiation versus risks to include thrombosis such as stroke heart attack DVT and breast cancer. Patient strongly wants to continue her HRT and I refilled her 1 year. Follow up if any bleeding. 2. Osteopenia. DEXA 07/2015 T score -1.4 FRAX 14%/0.7%.  Patient is going to have vitamin D level checked at her next blood draw with her primary physician coming up. Plan on repeat DEXA in another year or 2.  3. Mammography 01/2016. Continue with annual mammography when due. SBE monthly reviewed. 4. Colonoscopy unsure. Listed as 2007 but she is sure that she has had it since. Husband having colonoscopy coming up next week and she is going to ask the gastroenterologist when she is due in follow up with them in reference to this. 5. Pap smear/HPV 2013. Pap smear/HPV today. 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 6. Health maintenance. No routine blood work done as patient reports is done elsewhere. Follow up 1 year, sooner as needed.   Kristina Auerbach MD, 3:37 PM 07/24/2016

## 2016-07-24 NOTE — Addendum Note (Signed)
Addended by: Nelva Nay on: 07/24/2016 03:46 PM   Modules accepted: Orders

## 2016-07-26 LAB — PAP IG AND HPV HIGH-RISK: HPV DNA High Risk: NOT DETECTED

## 2016-08-05 DIAGNOSIS — Z0000 Encounter for general adult medical examination without abnormal findings: Secondary | ICD-10-CM | POA: Diagnosis not present

## 2016-08-05 DIAGNOSIS — E559 Vitamin D deficiency, unspecified: Secondary | ICD-10-CM | POA: Diagnosis not present

## 2016-08-05 DIAGNOSIS — F329 Major depressive disorder, single episode, unspecified: Secondary | ICD-10-CM | POA: Diagnosis not present

## 2016-08-05 DIAGNOSIS — E7800 Pure hypercholesterolemia, unspecified: Secondary | ICD-10-CM | POA: Diagnosis not present

## 2016-08-05 DIAGNOSIS — I10 Essential (primary) hypertension: Secondary | ICD-10-CM | POA: Diagnosis not present

## 2016-08-05 DIAGNOSIS — R002 Palpitations: Secondary | ICD-10-CM | POA: Diagnosis not present

## 2016-08-06 ENCOUNTER — Telehealth: Payer: Self-pay

## 2016-08-06 NOTE — Telephone Encounter (Signed)
Sent notes to scheduling 

## 2016-08-22 ENCOUNTER — Encounter: Payer: Self-pay | Admitting: Interventional Cardiology

## 2016-09-01 DIAGNOSIS — R002 Palpitations: Secondary | ICD-10-CM | POA: Insufficient documentation

## 2016-09-01 NOTE — Progress Notes (Signed)
Cardiology Office Note    Date:  09/02/2016   ID:  YARITZI CRAUN, DOB [birth year], MRN [id number]  PCP:  Reginia Naas, MD  Cardiologist: Sinclair Grooms, MD   Chief Complaint  Patient presents with  . Palpitations    History of Present Illness:  Kristina Moody is a 61 y.o. female evaluation of palpitations. She has a history of hyperlipidemia and hypertension.  She has a multiyear history of occasional palpitations. She can feel a flutter in her chest. If she feels her pulse it is regular with an occasional pause. No tachycardia. She denies chest discomfort. She was using pseudoephedrine which is been discontinued. She denies chest pain and dyspnea. She is a prior smoker but quit greater than 30 years ago.  Past Medical History:  Diagnosis Date  . Elevated cholesterol   . History of colposcopy with cervical biopsy 2004, 2005   LGSIL  . HSV (herpes simplex virus) infection   . Hx gestational diabetes   . Hypertension   . Osteopenia 07/2015   T score -1.4 FRAX 14%/0.7    Past Surgical History:  Procedure Laterality Date  . CESAREAN SECTION  06/15/1992   boy  . COLPOSCOPY    . COSMETIC SURGERY    . SKIN SURGERY     BIRTH MARK REMOVED (R) ARM    Current Medications: Outpatient Medications Prior to Visit  Medication Sig Dispense Refill  . CALCIUM PO Take 600 mg by mouth daily. Reported on 06/03/2015    . citalopram (CELEXA) 20 MG tablet Take 20 mg by mouth daily.    Marland Kitchen estradiol (ESTRACE) 1 MG tablet Take 1 tablet (1 mg total) by mouth daily. 30 tablet 12  . ezetimibe (ZETIA) 10 MG tablet Take 10 mg by mouth daily.    Marland Kitchen lovastatin (MEVACOR) 40 MG tablet Take 40 mg by mouth at bedtime.      . progesterone (PROMETRIUM) 100 MG capsule Take 1 capsule (100 mg total) by mouth at bedtime. 30 capsule 12  . valACYclovir (VALTREX) 500 MG tablet Take one tablet by mouth twice a day for 5 days, then as needed 20 tablet 1  . augmented betamethasone dipropionate  (DIPROLENE-AF) 0.05 % cream APPLY TOPICALLY TWICE DAILY. (Patient not taking: Reported on 09/02/2016) 50 g 1  . cholecalciferol (VITAMIN D) 1000 UNITS tablet Take 1,000 Units by mouth daily. Reported on 06/03/2015    . citalopram (CELEXA) 20 MG tablet Take 1 tablet (20 mg total) by mouth daily. 30 tablet 6  . clonazePAM (KLONOPIN) 0.5 MG tablet Take 0.5 mg by mouth 2 (two) times daily as needed. Reported on 06/03/2015    . lisinopril (PRINIVIL,ZESTRIL) 5 MG tablet Take 10 mg by mouth daily.     Marland Kitchen nystatin-triamcinolone ointment (MYCOLOG) Apply 1 application topically 2 (two) times daily. (Patient not taking: Reported on 09/02/2016) 30 g 0   No facility-administered medications prior to visit.      Allergies:   Codeine   Social History   Social History  . Marital status: Married    Spouse name: N/A  . Number of children: N/A  . Years of education: N/A   Social History Main Topics  . Smoking status: Former Research scientist (life sciences)  . Smokeless tobacco: Never Used  . Alcohol use 3.0 oz/week    5 Standard drinks or equivalent per week  . Drug use: No  . Sexual activity: Yes    Partners: Male    Birth control/ protection: Post-menopausal  Comment: 1st intercourse 61 yo-More than 5 partners   Other Topics Concern  . None   Social History Narrative  . None     Family History:  The patient's family history includes Cancer in her father; Dementia in her mother; Diabetes in her mother; Heart disease in her father and mother; Hypertension in her father and mother. Siblings do not have vascular problems. Both mother and father had coronary artery disease, father received a stent and mother had bypass surgery. They both lived to be 63 years of age.  ROS:   Please see the history of present illness.    Dry hacking cough, depression, snoring, at least one glass of white wine nightly. I encouraged her to decrease alcohol intake. All other systems reviewed and are negative.   PHYSICAL EXAM:   VS:  BP  136/84 (BP Location: Left Arm)   Pulse 82   Ht 5\' 6"  (1.676 m)   Wt 132 lb 3.2 oz (60 kg)   LMP 09/11/2008   BMI 21.34 kg/m    GEN: Well nourished, well developed, in no acute distress  HEENT: normal  Neck: no JVD, carotid bruits, or masses Cardiac: RRR; no murmurs, rubs, or gallops,no edema  Respiratory:  clear to auscultation bilaterally, normal work of breathing GI: soft, nontender, nondistended, + BS MS: no deformity or atrophy  Skin: warm and dry, no rash Neuro:  Alert and Oriented x 3, Strength and sensation are intact Psych: euthymic mood, full affect  Wt Readings from Last 3 Encounters:  09/02/16 132 lb 3.2 oz (60 kg)  07/24/16 134 lb (60.8 kg)  06/15/15 128 lb (58.1 kg)      Studies/Labs Reviewed:   EKG:  EKG  Normal sinus rhythm, nonspecific T wave flattening, poor R-wave progression.  Recent Labs: No results found for requested labs within last 8760 hours.   Lipid Panel No results found for: CHOL, TRIG, HDL, CHOLHDL, VLDL, LDLCALC, LDLDIRECT  Additional studies/ records that were reviewed today include:  Sodium 133, potassium 3.9, LDL cholesterol 82, total cholesterol 166, liver panel unremarkable, calcium 9.7, kidney function normal. Blood work performed on 08/05/2016 by Dr. Carol Ada at [facility].    ASSESSMENT:    1. Palpitations   2. Elevated cholesterol   3. Cough due to ACE inhibitor      PLAN:  In order of problems listed above:  1. The clinical history suggests premature atrial and or ventricular contractions. We'll plan to do a thirty-day continuous monitor. A 2-D Doppler echocardiogram will be done to exclude the possibility of structural abnormalities. 2. Adequate control with LDL of 82 on current medical regimen. 3. Probable ACE induced cough. Suggest switching lisinopril to Diovan.  Overall, I believe Kristina Moody is doing well from cardiac standpoint. She has probable ACE inhibitor related cough. Lipids are being managed very  well. Palpitations are likely PACs or PVCs. We will investigate with echo and 30 day monitor. Follow-up will be based upon clinical findings if necessary. Encouraged to decrease alcohol intake to less than 4 ounces daily.  Medication Adjustments/Labs and Tests Ordered: Current medicines are reviewed at length with the patient today.  Concerns regarding medicines are outlined above.  Medication changes, Labs and Tests ordered today are listed in the Patient Instructions below. Patient Instructions  Medication Instructions:  None  Labwork: None  Testing/Procedures: Your physician has requested that you have an echocardiogram. Echocardiography is a painless test that uses sound waves to create images of your heart. It  provides your doctor with information about the size and shape of your heart and how well your heart's chambers and valves are working. This procedure takes approximately one hour. There are no restrictions for this procedure.  Your physician has recommended that you wear an event monitor. Event monitors are medical devices that record the heart's electrical activity. Doctors most often [place] these monitors to diagnose arrhythmias. Arrhythmias are problems with the speed or rhythm of the heartbeat. The monitor is a small, portable device. You can wear one while you do your normal daily activities. This is usually used to diagnose what is causing palpitations/syncope (passing out).    Follow-Up: Your physician recommends that you schedule a follow-up appointment as needed with Dr. Tamala Julian.    Any Other Special Instructions Will Be Listed Below (If Applicable).     If you need a refill on your cardiac medications before your next appointment, please call your pharmacy.      Signed, Sinclair Grooms, MD  09/02/2016 9:32 AM    [facility] [place], [place], [place]  [address] Phone: [phone]; Fax: [phone]

## 2016-09-02 ENCOUNTER — Encounter: Payer: Self-pay | Admitting: Interventional Cardiology

## 2016-09-02 ENCOUNTER — Encounter (INDEPENDENT_AMBULATORY_CARE_PROVIDER_SITE_OTHER): Payer: Self-pay

## 2016-09-02 ENCOUNTER — Ambulatory Visit (INDEPENDENT_AMBULATORY_CARE_PROVIDER_SITE_OTHER): Payer: BLUE CROSS/BLUE SHIELD | Admitting: Interventional Cardiology

## 2016-09-02 VITALS — BP 136/84 | HR 82 | Ht 66.0 in | Wt 132.2 lb

## 2016-09-02 DIAGNOSIS — T464X5A Adverse effect of angiotensin-converting-enzyme inhibitors, initial encounter: Secondary | ICD-10-CM

## 2016-09-02 DIAGNOSIS — R05 Cough: Secondary | ICD-10-CM | POA: Diagnosis not present

## 2016-09-02 DIAGNOSIS — E7800 Pure hypercholesterolemia, unspecified: Secondary | ICD-10-CM

## 2016-09-02 DIAGNOSIS — R002 Palpitations: Secondary | ICD-10-CM | POA: Diagnosis not present

## 2016-09-02 DIAGNOSIS — R058 Other specified cough: Secondary | ICD-10-CM

## 2016-09-02 NOTE — Patient Instructions (Signed)
Medication Instructions:  None  Labwork: None  Testing/Procedures: Your physician has requested that you have an echocardiogram. Echocardiography is a painless test that uses sound waves to create images of your heart. It provides your doctor with information about the size and shape of your heart and how well your heart's chambers and valves are working. This procedure takes approximately one hour. There are no restrictions for this procedure.  Your physician has recommended that you wear an event monitor. Event monitors are medical devices that record the heart's electrical activity. Doctors most often [place] these monitors to diagnose arrhythmias. Arrhythmias are problems with the speed or rhythm of the heartbeat. The monitor is a small, portable device. You can wear one while you do your normal daily activities. This is usually used to diagnose what is causing palpitations/syncope (passing out).    Follow-Up: Your physician recommends that you schedule a follow-up appointment as needed with Dr. Tamala Julian.    Any Other Special Instructions Will Be Listed Below (If Applicable).     If you need a refill on your cardiac medications before your next appointment, please call your pharmacy.

## 2016-09-17 ENCOUNTER — Ambulatory Visit (INDEPENDENT_AMBULATORY_CARE_PROVIDER_SITE_OTHER): Payer: BLUE CROSS/BLUE SHIELD

## 2016-09-17 ENCOUNTER — Encounter (INDEPENDENT_AMBULATORY_CARE_PROVIDER_SITE_OTHER): Payer: Self-pay

## 2016-09-17 ENCOUNTER — Other Ambulatory Visit: Payer: Self-pay

## 2016-09-17 ENCOUNTER — Ambulatory Visit (HOSPITAL_COMMUNITY): Payer: BLUE CROSS/BLUE SHIELD | Attending: Internal Medicine

## 2016-09-17 DIAGNOSIS — Z87891 Personal history of nicotine dependence: Secondary | ICD-10-CM | POA: Diagnosis not present

## 2016-09-17 DIAGNOSIS — R002 Palpitations: Secondary | ICD-10-CM | POA: Insufficient documentation

## 2016-09-17 DIAGNOSIS — E785 Hyperlipidemia, unspecified: Secondary | ICD-10-CM | POA: Insufficient documentation

## 2016-10-26 ENCOUNTER — Encounter: Payer: Self-pay | Admitting: Interventional Cardiology

## 2016-10-28 ENCOUNTER — Telehealth: Payer: Self-pay | Admitting: Interventional Cardiology

## 2016-10-28 NOTE — Telephone Encounter (Signed)
New Message ° ° pt verbalized that she is returning call for rn °

## 2016-10-28 NOTE — Telephone Encounter (Signed)
Informed pt of monitor results. Pt verbalized understanding. 

## 2016-12-27 ENCOUNTER — Other Ambulatory Visit: Payer: Self-pay | Admitting: Gynecology

## 2016-12-27 DIAGNOSIS — Z1231 Encounter for screening mammogram for malignant neoplasm of breast: Secondary | ICD-10-CM

## 2017-02-07 ENCOUNTER — Ambulatory Visit
Admission: RE | Admit: 2017-02-07 | Discharge: 2017-02-07 | Disposition: A | Discharge status: Home / Self Care | Payer: BLUE CROSS/BLUE SHIELD | Source: Ambulatory Visit | Attending: Gynecology | Admitting: Gynecology

## 2017-02-07 DIAGNOSIS — Z1231 Encounter for screening mammogram for malignant neoplasm of breast: Secondary | ICD-10-CM

## 2017-03-09 DIAGNOSIS — N390 Urinary tract infection, site not specified: Secondary | ICD-10-CM | POA: Diagnosis not present

## 2017-03-09 DIAGNOSIS — R300 Dysuria: Secondary | ICD-10-CM | POA: Diagnosis not present

## 2017-03-25 DIAGNOSIS — E7800 Pure hypercholesterolemia, unspecified: Secondary | ICD-10-CM | POA: Diagnosis not present

## 2017-03-25 DIAGNOSIS — F325 Major depressive disorder, single episode, in full remission: Secondary | ICD-10-CM | POA: Diagnosis not present

## 2017-03-25 DIAGNOSIS — G4700 Insomnia, unspecified: Secondary | ICD-10-CM | POA: Diagnosis not present

## 2017-03-25 DIAGNOSIS — I10 Essential (primary) hypertension: Secondary | ICD-10-CM | POA: Diagnosis not present

## 2017-04-16 ENCOUNTER — Telehealth: Payer: Self-pay | Admitting: *Deleted

## 2017-04-16 MED ORDER — VALACYCLOVIR HCL 500 MG PO TABS
ORAL_TABLET | ORAL | 1 refills | Status: DC
Start: 2017-04-16 — End: 2020-11-09

## 2017-04-16 NOTE — Telephone Encounter (Signed)
Patient requesting refill Valtrex 500 mg tablets, having HSV outbreak. Rx sent.

## 2017-07-11 DIAGNOSIS — M8588 Other specified disorders of bone density and structure, other site: Secondary | ICD-10-CM

## 2017-07-11 HISTORY — DX: Other specified disorders of bone density and structure, other site: M85.88

## 2017-07-16 ENCOUNTER — Other Ambulatory Visit: Payer: Self-pay | Admitting: Gynecology

## 2017-07-16 DIAGNOSIS — M8589 Other specified disorders of bone density and structure, multiple sites: Secondary | ICD-10-CM

## 2017-07-16 DIAGNOSIS — Z1211 Encounter for screening for malignant neoplasm of colon: Secondary | ICD-10-CM | POA: Diagnosis not present

## 2017-07-16 DIAGNOSIS — Z13820 Encounter for screening for osteoporosis: Secondary | ICD-10-CM

## 2017-07-16 DIAGNOSIS — K635 Polyp of colon: Secondary | ICD-10-CM | POA: Diagnosis not present

## 2017-07-16 DIAGNOSIS — Z8371 Family history of colonic polyps: Secondary | ICD-10-CM | POA: Diagnosis not present

## 2017-07-22 DIAGNOSIS — Z1211 Encounter for screening for malignant neoplasm of colon: Secondary | ICD-10-CM | POA: Diagnosis not present

## 2017-07-22 DIAGNOSIS — K635 Polyp of colon: Secondary | ICD-10-CM | POA: Diagnosis not present

## 2017-07-28 ENCOUNTER — Other Ambulatory Visit: Payer: Self-pay | Admitting: Gynecology

## 2017-07-28 ENCOUNTER — Ambulatory Visit (INDEPENDENT_AMBULATORY_CARE_PROVIDER_SITE_OTHER): Payer: BLUE CROSS/BLUE SHIELD

## 2017-07-28 DIAGNOSIS — M8589 Other specified disorders of bone density and structure, multiple sites: Secondary | ICD-10-CM

## 2017-07-28 DIAGNOSIS — Z13820 Encounter for screening for osteoporosis: Secondary | ICD-10-CM

## 2017-07-29 ENCOUNTER — Encounter: Payer: Self-pay | Admitting: Gynecology

## 2017-07-29 ENCOUNTER — Encounter: Payer: BLUE CROSS/BLUE SHIELD | Admitting: Gynecology

## 2017-07-30 ENCOUNTER — Encounter: Payer: Self-pay | Admitting: Gynecology

## 2017-07-30 ENCOUNTER — Ambulatory Visit (INDEPENDENT_AMBULATORY_CARE_PROVIDER_SITE_OTHER): Payer: BLUE CROSS/BLUE SHIELD | Admitting: Gynecology

## 2017-07-30 VITALS — BP 120/74 | Ht 66.0 in | Wt 135.0 lb

## 2017-07-30 DIAGNOSIS — Z79890 Hormone replacement therapy (postmenopausal): Secondary | ICD-10-CM | POA: Diagnosis not present

## 2017-07-30 DIAGNOSIS — M8588 Other specified disorders of bone density and structure, other site: Secondary | ICD-10-CM

## 2017-07-30 DIAGNOSIS — Z01411 Encounter for gynecological examination (general) (routine) with abnormal findings: Secondary | ICD-10-CM

## 2017-07-30 DIAGNOSIS — N952 Postmenopausal atrophic vaginitis: Secondary | ICD-10-CM

## 2017-07-30 DIAGNOSIS — Z01419 Encounter for gynecological examination (general) (routine) without abnormal findings: Secondary | ICD-10-CM | POA: Diagnosis not present

## 2017-07-30 DIAGNOSIS — M8580 Other specified disorders of bone density and structure, unspecified site: Secondary | ICD-10-CM

## 2017-07-30 MED ORDER — PROGESTERONE MICRONIZED 100 MG PO CAPS: 100.0000 mg | ORAL_CAPSULE | Freq: Every day | ORAL | 12 refills | Status: DC

## 2017-07-30 MED ORDER — ESTRADIOL 1 MG PO TABS: 1.0000 mg | ORAL_TABLET | Freq: Every day | ORAL | 12 refills | Status: DC

## 2017-07-30 MED ORDER — ESTROGENS, CONJUGATED 0.625 MG/GM VA CREA: 1.0000 | TOPICAL_CREAM | Freq: Every day | VAGINAL | 2 refills | Status: DC

## 2017-07-30 NOTE — Patient Instructions (Signed)
Follow-up in 1 year, sooner as needed. 

## 2017-07-30 NOTE — Progress Notes (Signed)
    Kristina Moody [facility] 11/18/1955 [id number]        62 y.o.  [id number] for annual gynecologic exam.  Doing well without gynecologic complaints.  Past medical history,surgical history, problem list, medications, allergies, family history and social history were all reviewed and documented as reviewed in the EPIC chart.  ROS:  Performed with pertinent positives and negatives included in the history, assessment and plan.   Additional significant findings : None   Exam: [name] assistant Vitals:   07/30/17 1557  BP: 120/74  Weight: 135 lb (61.2 kg)  Height: 5\' 6"  (1.676 m)   Body mass index is 21.79 kg/m.  General appearance:  Normal affect, orientation and appearance. Skin: Grossly normal HEENT: Without gross lesions.  No cervical or supraclavicular adenopathy. Thyroid normal.  Lungs:  Clear without wheezing, rales or rhonchi Cardiac: RR, without RMG Abdominal:  Soft, nontender, without masses, guarding, rebound, organomegaly or hernia Breasts:  Examined lying and sitting without masses, retractions, discharge or axillary adenopathy. Pelvic:  Ext, BUS, Vagina: With atrophic changes  Cervix: With atrophic changes  Uterus: Anteverted, normal size, shape and contour, midline and mobile nontender   Adnexa: Without masses or tenderness    Anus and perineum: Normal   Rectovaginal: Normal sphincter tone without palpated masses or tenderness.    Assessment/Plan:  62 y.o. [id number] female for annual gynecologic exam.   1. Postmenopausal/atrophic genital changes/HRT.  On estradiol 1 mg and Prometrium 100 mg at bedtime.  Doing well and wants to continue.  I again reviewed the whole issue of HRT, risks versus benefits.  Symptom relief as well as cardiovascular and bone health versus thrombosis such as stroke heart attack DVT and breast cancer all discussed.  At this point she wants to continue and refill times 1 year provided.  She asked for refill of Premarin vaginal cream that she uses once  or twice monthly when she feels a little bit of irritation and this seems to help her.  Refill times 1 year provided for this also. 2. Osteopenia.  07/2017 T score -1.6 FRAX 17% / 1.8%.  Stable from prior DEXA.  She is going to have her vitamin D level checked in several days at her primary physician's office.  Plan repeat DEXA in several years. 3. Mammography 01/2017.  Continue with annual mammography when due.  Breast exam normal today. 4. Pap smear/HPV 2018.  No Pap smear done today.  History of LGSIL 2000 09/2003 with normal Pap smears since.  Plan repeat Pap smear at 5-year interval per current screening guidelines. 5. Colonoscopy 2019. 6. Health maintenance.  No routine blood work done as patient does this elsewhere.  Follow-up 1 year, sooner as needed.   [name] MD, 4:40 PM 07/30/2017

## 2017-07-31 ENCOUNTER — Encounter: Payer: Self-pay | Admitting: *Deleted

## 2017-08-07 DIAGNOSIS — I10 Essential (primary) hypertension: Secondary | ICD-10-CM | POA: Diagnosis not present

## 2017-08-07 DIAGNOSIS — G4700 Insomnia, unspecified: Secondary | ICD-10-CM | POA: Diagnosis not present

## 2017-08-07 DIAGNOSIS — E7800 Pure hypercholesterolemia, unspecified: Secondary | ICD-10-CM | POA: Diagnosis not present

## 2017-08-07 DIAGNOSIS — R05 Cough: Secondary | ICD-10-CM | POA: Diagnosis not present

## 2017-08-07 DIAGNOSIS — Z0000 Encounter for general adult medical examination without abnormal findings: Secondary | ICD-10-CM | POA: Diagnosis not present

## 2017-08-07 DIAGNOSIS — E559 Vitamin D deficiency, unspecified: Secondary | ICD-10-CM | POA: Diagnosis not present

## 2017-08-14 ENCOUNTER — Telehealth: Payer: Self-pay | Admitting: *Deleted

## 2017-08-14 DIAGNOSIS — J209 Acute bronchitis, unspecified: Secondary | ICD-10-CM | POA: Diagnosis not present

## 2017-08-14 DIAGNOSIS — R509 Fever, unspecified: Secondary | ICD-10-CM | POA: Diagnosis not present

## 2017-08-14 DIAGNOSIS — R05 Cough: Secondary | ICD-10-CM | POA: Diagnosis not present

## 2017-08-14 NOTE — Telephone Encounter (Signed)
Pt called c/o slight vaginal itch, states she had the flu and has been on antibiotics, I recommended she try OTC monistat for external use, pt said only externally itching. Pt verbalized she understood.

## 2017-08-16 DIAGNOSIS — J189 Pneumonia, unspecified organism: Secondary | ICD-10-CM | POA: Diagnosis not present

## 2017-09-04 DIAGNOSIS — J189 Pneumonia, unspecified organism: Secondary | ICD-10-CM | POA: Diagnosis not present

## 2017-09-04 DIAGNOSIS — R7309 Other abnormal glucose: Secondary | ICD-10-CM | POA: Diagnosis not present

## 2017-09-09 DIAGNOSIS — H5213 Myopia, bilateral: Secondary | ICD-10-CM | POA: Diagnosis not present

## 2017-09-09 DIAGNOSIS — H2513 Age-related nuclear cataract, bilateral: Secondary | ICD-10-CM | POA: Diagnosis not present

## 2017-09-17 DIAGNOSIS — J180 Bronchopneumonia, unspecified organism: Secondary | ICD-10-CM | POA: Diagnosis not present

## 2017-09-17 DIAGNOSIS — R05 Cough: Secondary | ICD-10-CM | POA: Diagnosis not present

## 2017-10-10 DIAGNOSIS — L905 Scar conditions and fibrosis of skin: Secondary | ICD-10-CM | POA: Diagnosis not present

## 2017-10-10 DIAGNOSIS — L720 Epidermal cyst: Secondary | ICD-10-CM | POA: Diagnosis not present

## 2017-10-10 DIAGNOSIS — D1801 Hemangioma of skin and subcutaneous tissue: Secondary | ICD-10-CM | POA: Diagnosis not present

## 2017-10-10 DIAGNOSIS — L821 Other seborrheic keratosis: Secondary | ICD-10-CM | POA: Diagnosis not present

## 2017-10-14 ENCOUNTER — Other Ambulatory Visit: Payer: Self-pay | Admitting: Family Medicine

## 2017-10-14 ENCOUNTER — Ambulatory Visit
Admission: RE | Admit: 2017-10-14 | Discharge: 2017-10-14 | Disposition: A | Discharge status: Home / Self Care | Payer: BLUE CROSS/BLUE SHIELD | Source: Ambulatory Visit | Attending: Family Medicine | Admitting: Family Medicine

## 2017-10-14 DIAGNOSIS — Z09 Encounter for follow-up examination after completed treatment for conditions other than malignant neoplasm: Secondary | ICD-10-CM

## 2017-10-14 DIAGNOSIS — J9811 Atelectasis: Secondary | ICD-10-CM | POA: Diagnosis not present

## 2017-10-30 DIAGNOSIS — L720 Epidermal cyst: Secondary | ICD-10-CM | POA: Diagnosis not present

## 2017-12-31 ENCOUNTER — Other Ambulatory Visit: Payer: Self-pay | Admitting: Gynecology

## 2017-12-31 DIAGNOSIS — Z1231 Encounter for screening mammogram for malignant neoplasm of breast: Secondary | ICD-10-CM

## 2018-01-19 DIAGNOSIS — S40861A Insect bite (nonvenomous) of right upper arm, initial encounter: Secondary | ICD-10-CM | POA: Diagnosis not present

## 2018-01-19 DIAGNOSIS — S70361A Insect bite (nonvenomous), right thigh, initial encounter: Secondary | ICD-10-CM | POA: Diagnosis not present

## 2018-01-19 DIAGNOSIS — T63441A Toxic effect of venom of bees, accidental (unintentional), initial encounter: Secondary | ICD-10-CM | POA: Diagnosis not present

## 2018-02-10 ENCOUNTER — Encounter (INDEPENDENT_AMBULATORY_CARE_PROVIDER_SITE_OTHER): Payer: Self-pay

## 2018-02-10 ENCOUNTER — Ambulatory Visit
Admission: RE | Admit: 2018-02-10 | Discharge: 2018-02-10 | Disposition: A | Discharge status: Home / Self Care | Payer: BLUE CROSS/BLUE SHIELD | Source: Ambulatory Visit | Attending: Gynecology | Admitting: Gynecology

## 2018-02-10 DIAGNOSIS — Z1231 Encounter for screening mammogram for malignant neoplasm of breast: Secondary | ICD-10-CM | POA: Diagnosis not present

## 2018-03-17 DIAGNOSIS — Z23 Encounter for immunization: Secondary | ICD-10-CM | POA: Diagnosis not present

## 2018-04-19 DIAGNOSIS — M542 Cervicalgia: Secondary | ICD-10-CM | POA: Diagnosis not present

## 2018-04-20 DIAGNOSIS — E7800 Pure hypercholesterolemia, unspecified: Secondary | ICD-10-CM | POA: Diagnosis not present

## 2018-04-20 DIAGNOSIS — I10 Essential (primary) hypertension: Secondary | ICD-10-CM | POA: Diagnosis not present

## 2018-04-20 DIAGNOSIS — R7303 Prediabetes: Secondary | ICD-10-CM | POA: Diagnosis not present

## 2018-04-20 DIAGNOSIS — G4700 Insomnia, unspecified: Secondary | ICD-10-CM | POA: Diagnosis not present

## 2018-04-20 DIAGNOSIS — F325 Major depressive disorder, single episode, in full remission: Secondary | ICD-10-CM | POA: Diagnosis not present

## 2018-04-28 DIAGNOSIS — M542 Cervicalgia: Secondary | ICD-10-CM | POA: Diagnosis not present

## 2018-05-05 DIAGNOSIS — M47892 Other spondylosis, cervical region: Secondary | ICD-10-CM | POA: Diagnosis not present

## 2018-05-05 DIAGNOSIS — M5033 Other cervical disc degeneration, cervicothoracic region: Secondary | ICD-10-CM | POA: Diagnosis not present

## 2018-05-05 DIAGNOSIS — M542 Cervicalgia: Secondary | ICD-10-CM | POA: Diagnosis not present

## 2018-05-11 DIAGNOSIS — M542 Cervicalgia: Secondary | ICD-10-CM | POA: Diagnosis not present

## 2018-05-11 DIAGNOSIS — M5033 Other cervical disc degeneration, cervicothoracic region: Secondary | ICD-10-CM | POA: Diagnosis not present

## 2018-05-11 DIAGNOSIS — M47892 Other spondylosis, cervical region: Secondary | ICD-10-CM | POA: Diagnosis not present

## 2018-05-14 DIAGNOSIS — M542 Cervicalgia: Secondary | ICD-10-CM | POA: Diagnosis not present

## 2018-05-14 DIAGNOSIS — M5033 Other cervical disc degeneration, cervicothoracic region: Secondary | ICD-10-CM | POA: Diagnosis not present

## 2018-05-14 DIAGNOSIS — M47892 Other spondylosis, cervical region: Secondary | ICD-10-CM | POA: Diagnosis not present

## 2018-05-18 DIAGNOSIS — M5033 Other cervical disc degeneration, cervicothoracic region: Secondary | ICD-10-CM | POA: Diagnosis not present

## 2018-05-18 DIAGNOSIS — M47892 Other spondylosis, cervical region: Secondary | ICD-10-CM | POA: Diagnosis not present

## 2018-05-18 DIAGNOSIS — M542 Cervicalgia: Secondary | ICD-10-CM | POA: Diagnosis not present

## 2018-05-21 DIAGNOSIS — M5033 Other cervical disc degeneration, cervicothoracic region: Secondary | ICD-10-CM | POA: Diagnosis not present

## 2018-05-21 DIAGNOSIS — M542 Cervicalgia: Secondary | ICD-10-CM | POA: Diagnosis not present

## 2018-05-21 DIAGNOSIS — M47892 Other spondylosis, cervical region: Secondary | ICD-10-CM | POA: Diagnosis not present

## 2018-05-25 DIAGNOSIS — M5033 Other cervical disc degeneration, cervicothoracic region: Secondary | ICD-10-CM | POA: Diagnosis not present

## 2018-05-25 DIAGNOSIS — M542 Cervicalgia: Secondary | ICD-10-CM | POA: Diagnosis not present

## 2018-05-25 DIAGNOSIS — M47892 Other spondylosis, cervical region: Secondary | ICD-10-CM | POA: Diagnosis not present

## 2018-05-26 DIAGNOSIS — M47892 Other spondylosis, cervical region: Secondary | ICD-10-CM | POA: Diagnosis not present

## 2018-05-26 DIAGNOSIS — M5033 Other cervical disc degeneration, cervicothoracic region: Secondary | ICD-10-CM | POA: Diagnosis not present

## 2018-06-01 DIAGNOSIS — M542 Cervicalgia: Secondary | ICD-10-CM | POA: Diagnosis not present

## 2018-06-01 DIAGNOSIS — M5033 Other cervical disc degeneration, cervicothoracic region: Secondary | ICD-10-CM | POA: Diagnosis not present

## 2018-06-01 DIAGNOSIS — M47892 Other spondylosis, cervical region: Secondary | ICD-10-CM | POA: Diagnosis not present

## 2018-06-04 DIAGNOSIS — M47892 Other spondylosis, cervical region: Secondary | ICD-10-CM | POA: Diagnosis not present

## 2018-06-04 DIAGNOSIS — M542 Cervicalgia: Secondary | ICD-10-CM | POA: Diagnosis not present

## 2018-06-04 DIAGNOSIS — M5033 Other cervical disc degeneration, cervicothoracic region: Secondary | ICD-10-CM | POA: Diagnosis not present

## 2018-06-08 DIAGNOSIS — M542 Cervicalgia: Secondary | ICD-10-CM | POA: Diagnosis not present

## 2018-06-08 DIAGNOSIS — M47892 Other spondylosis, cervical region: Secondary | ICD-10-CM | POA: Diagnosis not present

## 2018-06-08 DIAGNOSIS — M5033 Other cervical disc degeneration, cervicothoracic region: Secondary | ICD-10-CM | POA: Diagnosis not present

## 2018-06-30 DIAGNOSIS — L570 Actinic keratosis: Secondary | ICD-10-CM | POA: Diagnosis not present

## 2018-06-30 DIAGNOSIS — M5033 Other cervical disc degeneration, cervicothoracic region: Secondary | ICD-10-CM | POA: Diagnosis not present

## 2018-06-30 DIAGNOSIS — M542 Cervicalgia: Secondary | ICD-10-CM | POA: Diagnosis not present

## 2018-06-30 DIAGNOSIS — M5412 Radiculopathy, cervical region: Secondary | ICD-10-CM | POA: Diagnosis not present

## 2018-08-03 ENCOUNTER — Encounter: Payer: BLUE CROSS/BLUE SHIELD | Admitting: Gynecology

## 2018-08-03 DIAGNOSIS — H00014 Hordeolum externum left upper eyelid: Secondary | ICD-10-CM | POA: Diagnosis not present

## 2018-08-03 DIAGNOSIS — H1032 Unspecified acute conjunctivitis, left eye: Secondary | ICD-10-CM | POA: Diagnosis not present

## 2018-08-08 DIAGNOSIS — B349 Viral infection, unspecified: Secondary | ICD-10-CM | POA: Diagnosis not present

## 2018-08-09 DIAGNOSIS — I10 Essential (primary) hypertension: Secondary | ICD-10-CM | POA: Diagnosis not present

## 2018-08-09 DIAGNOSIS — J0100 Acute maxillary sinusitis, unspecified: Secondary | ICD-10-CM | POA: Diagnosis not present

## 2018-08-24 ENCOUNTER — Other Ambulatory Visit: Payer: Self-pay | Admitting: Gynecology

## 2018-08-24 NOTE — Telephone Encounter (Signed)
Annual schedule on 08/31/18

## 2018-08-27 ENCOUNTER — Other Ambulatory Visit: Payer: Self-pay

## 2018-08-31 ENCOUNTER — Ambulatory Visit (INDEPENDENT_AMBULATORY_CARE_PROVIDER_SITE_OTHER): Payer: BLUE CROSS/BLUE SHIELD | Admitting: Gynecology

## 2018-08-31 ENCOUNTER — Encounter: Payer: Self-pay | Admitting: Gynecology

## 2018-08-31 ENCOUNTER — Other Ambulatory Visit: Payer: Self-pay

## 2018-08-31 VITALS — BP 150/84 | Ht 66.0 in | Wt 133.0 lb

## 2018-08-31 DIAGNOSIS — N952 Postmenopausal atrophic vaginitis: Secondary | ICD-10-CM

## 2018-08-31 DIAGNOSIS — Z01419 Encounter for gynecological examination (general) (routine) without abnormal findings: Secondary | ICD-10-CM | POA: Diagnosis not present

## 2018-08-31 DIAGNOSIS — M8580 Other specified disorders of bone density and structure, unspecified site: Secondary | ICD-10-CM

## 2018-08-31 DIAGNOSIS — Z79890 Hormone replacement therapy (postmenopausal): Secondary | ICD-10-CM

## 2018-08-31 MED ORDER — ESTRADIOL 1 MG PO TABS: 1.0000 mg | ORAL_TABLET | Freq: Every day | ORAL | 4 refills | Status: DC

## 2018-08-31 MED ORDER — PROGESTERONE MICRONIZED 100 MG PO CAPS: ORAL_CAPSULE | ORAL | 4 refills | Status: DC

## 2018-08-31 NOTE — Patient Instructions (Signed)
Follow-up in 1 year for annual exam, sooner if any issues. 

## 2018-08-31 NOTE — Progress Notes (Signed)
    Kristina Moody [facility] [birth year] [id number]        63 y.o.  [id number] for annual gynecologic exam.  Without gynecologic complaints.  Past medical history,surgical history, problem list, medications, allergies, family history and social history were all reviewed and documented as reviewed in the EPIC chart.  ROS:  Performed with pertinent positives and negatives included in the history, assessment and plan.   Additional significant findings : None   Exam: [name] assistant Vitals:   08/31/18 1033  BP: (!) 150/84  Weight: 133 lb (60.3 kg)  Height: 5\' 6"  (1.676 m)   Body mass index is 21.47 kg/m.  General appearance:  Normal affect, orientation and appearance. Skin: Grossly normal HEENT: Without gross lesions.  No cervical or supraclavicular adenopathy. Thyroid normal.  Lungs:  Clear without wheezing, rales or rhonchi Cardiac: RR, without RMG Abdominal:  Soft, nontender, without masses, guarding, rebound, organomegaly or hernia Breasts:  Examined lying and sitting without masses, retractions, discharge or axillary adenopathy. Pelvic:  Ext, BUS, Vagina: With atrophic changes  Cervix: With atrophic changes  Uterus: Anteverted, normal size, shape and contour, midline and mobile nontender   Adnexa: Without masses or tenderness    Anus and perineum: Normal   Rectovaginal: Normal sphincter tone without palpated masses or tenderness.    Assessment/Plan:  63 y.o. [id number] female for annual gynecologic exam.   1. Postmenopausal/atrophic genital changes.  Continues on estradiol 1 mg and Prometrium 100 mg at bedtime.  Doing well.  Wants to continue.  We have discussed multiple times the risks versus benefits of HRT and she is comfortable continuing.  Refill x1 year provided.  Also uses Premarin vaginal cream intermittently as needed for irritation.  Will call when needs more. 2. Osteopenia.  DEXA 2019 T score -1.6 FRAX 17% / 1.8%.  Stable from prior DEXA.  Plan repeat DEXA next year at  2-year interval. 3. Mammography 02/2018.  Continue with annual mammography when due.  Breast exam normal today. 4. Pap smear/HPV 2018.  No Pap smear done today.  History of LGSIL 2005.  Normal Pap smears since.  Plan repeat Pap smear/HPV at 5-year interval per current screening guidelines. 5. Colonoscopy 2019.  Repeat at their recommended interval. 6. Health maintenance.  Blood pressure noted at 150/84.  She did not take her blood pressure medicine this morning.  Sh will follow her blood pressure to make sure that it returns to normal.  No routine blood work done as patient does this elsewhere.  Follow-up 1 year, sooner as needed.   [name] MD, 11:12 AM 08/31/2018

## 2018-09-17 DIAGNOSIS — E7800 Pure hypercholesterolemia, unspecified: Secondary | ICD-10-CM | POA: Diagnosis not present

## 2018-09-17 DIAGNOSIS — I10 Essential (primary) hypertension: Secondary | ICD-10-CM | POA: Diagnosis not present

## 2018-09-17 DIAGNOSIS — F325 Major depressive disorder, single episode, in full remission: Secondary | ICD-10-CM | POA: Diagnosis not present

## 2018-09-17 DIAGNOSIS — G4700 Insomnia, unspecified: Secondary | ICD-10-CM | POA: Diagnosis not present

## 2018-11-23 ENCOUNTER — Ambulatory Visit (INDEPENDENT_AMBULATORY_CARE_PROVIDER_SITE_OTHER): Payer: BC Managed Care – PPO | Admitting: Women's Health

## 2018-11-23 ENCOUNTER — Encounter: Payer: Self-pay | Admitting: Women's Health

## 2018-11-23 ENCOUNTER — Other Ambulatory Visit: Payer: Self-pay

## 2018-11-23 VITALS — BP 120/74

## 2018-11-23 DIAGNOSIS — N898 Other specified noninflammatory disorders of vagina: Secondary | ICD-10-CM | POA: Diagnosis not present

## 2018-11-23 DIAGNOSIS — R300 Dysuria: Secondary | ICD-10-CM

## 2018-11-23 MED ORDER — PHENAZOPYRIDINE HCL 200 MG PO TABS: 200.0000 mg | ORAL_TABLET | Freq: Three times a day (TID) | ORAL | 0 refills | Status: DC | PRN

## 2018-11-23 NOTE — Progress Notes (Signed)
63 year old MWF G4 P2 presents with complaint of questionable UTI.  States has had increased bladder pressure, burning sensation, frequency with urination for the past 2 to 3 days, symptoms started after intercourse.  Denies vaginal symptoms/discharge, abdominal or back pain, nausea, incontinence or fever.  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  Postmenopausal on HRT with no bleeding.  Medical problems include hypercholesteremia, anxiety/depression.  Exam: Appears well.  No CVAT.  Abdomen soft, nontender, external genitalia within normal limits, speculum exam mild vaginal atrophy , with no visible discharge, wet prep negative.  Bimanual no CMT or bladder tenderness UA: Negative nitrites, negative leukocytes, no WBCs, no RBCs, 6-10 squamous epithelials, no bacteria  Urinary burning  Plan: Urine culture pending.  Reviewed importance of vaginal lubricants with intercourse, loose clothing, increase water consumption.  Instructed to call if symptoms persist.  Pyridium 200 mg 3 times daily as needed prescription, proper use given and reviewed.

## 2018-11-23 NOTE — Patient Instructions (Signed)
Urinary Frequency, Adult  Urinary frequency means urinating more often than usual. You may urinate every 1-2 hours even though you drink a normal amount of fluid and do not have a bladder infection or condition. Although you urinate more often than normal, the total amount of urine produced in a day is normal.  With urinary frequency, you may have an urgent need to urinate often. The stress and anxiety of needing to find a bathroom quickly can make this urge worse. This condition may go away on its own or you may need treatment at home. Home treatment may include bladder training, exercises, taking medicines, or making changes to your diet.  Follow these instructions at home:  Bladder health     Keep a bladder diary if told by your health care provider. Keep track of:  ? What you eat and drink.  ? How often you urinate.  ? How much you urinate.   Follow a bladder training program if told by your health care provider. This may include:  ? Learning to delay going to the bathroom.  ? Double urinating (voiding). This helps if you are not completely emptying your bladder.  ? Scheduled voiding.   Do Kegel exercises as told by your health care provider. Kegel exercises strengthen the muscles that help control urination, which may help the condition.  Eating and drinking   If told by your health care provider, make diet changes, such as:  ? Avoiding caffeine.  ? Drinking fewer fluids, especially alcohol.  ? Not drinking in the evening.  ? Avoiding foods or drinks that may irritate the bladder. These include coffee, tea, soda, artificial sweeteners, citrus, tomato-based foods, and chocolate.  ? Eating foods that help prevent or ease constipation. Constipation can make this condition worse. Your health care provider may recommend that you:   Drink enough fluid to keep your urine pale yellow.   Take over-the-counter or prescription medicines.   Eat foods that are high in fiber, such as beans, whole grains, and fresh  fruits and vegetables.   Limit foods that are high in fat and processed sugars, such as fried or sweet foods.  General instructions   Take over-the-counter and prescription medicines only as told by your health care provider.   Keep all follow-up visits as told by your health care provider. This is important.  Contact a health care provider if:   You start urinating more often.   You feel pain or irritation when you urinate.   You notice blood in your urine.   Your urine looks cloudy.   You develop a fever.   You begin vomiting.  Get help right away if:   You are unable to urinate.  Summary   Urinary frequency means urinating more often than usual. With urinary frequency, you may urinate every 1-2 hours even though you drink a normal amount of fluid and do not have a bladder infection or other bladder condition.   Your health care provider may recommend that you keep a bladder diary, follow a bladder training program, or make dietary changes.   If told by your health care provider, do Kegel exercises to strengthen the muscles that help control urination.   Take over-the-counter and prescription medicines only as told by your health care provider.   Contact a health care provider if your symptoms do not improve or get worse.  This information is not intended to replace advice given to you by your health care provider. Make sure you   discuss any questions you have with your health care provider.  Document Released: 03/23/2009 Document Revised: 12/04/2017 Document Reviewed: 12/04/2017  Elsevier Interactive Patient Education  2019 [employer]

## 2018-11-24 LAB — WET PREP FOR TRICH, YEAST, CLUE

## 2018-11-25 LAB — URINE CULTURE
MICRO NUMBER:: 569752
Result:: NO GROWTH
SPECIMEN QUALITY:: ADEQUATE

## 2018-11-25 LAB — URINALYSIS, COMPLETE W/RFL CULTURE
Bacteria, UA: NONE SEEN /HPF
Bilirubin Urine: NEGATIVE
Glucose, UA: NEGATIVE
Hgb urine dipstick: NEGATIVE
Hyaline Cast: NONE SEEN /LPF
Ketones, ur: NEGATIVE
Leukocyte Esterase: NEGATIVE
Nitrites, Initial: NEGATIVE
Protein, ur: NEGATIVE
RBC / HPF: NONE SEEN /HPF (ref 0–2)
Specific Gravity, Urine: 1.01 (ref 1.001–1.03)
WBC, UA: NONE SEEN /HPF (ref 0–5)
pH: 6.5 (ref 5.0–8.0)

## 2018-11-25 LAB — NO CULTURE INDICATED

## 2018-11-30 ENCOUNTER — Other Ambulatory Visit: Payer: Self-pay

## 2018-11-30 MED ORDER — PREMARIN 0.625 MG/GM VA CREA: 1.0000 | TOPICAL_CREAM | Freq: Every day | VAGINAL | 4 refills | Status: DC

## 2018-12-18 ENCOUNTER — Other Ambulatory Visit: Payer: Self-pay

## 2018-12-21 ENCOUNTER — Other Ambulatory Visit: Payer: Self-pay

## 2018-12-21 ENCOUNTER — Encounter: Payer: Self-pay | Admitting: Gynecology

## 2018-12-21 ENCOUNTER — Ambulatory Visit (INDEPENDENT_AMBULATORY_CARE_PROVIDER_SITE_OTHER): Payer: BC Managed Care – PPO | Admitting: Gynecology

## 2018-12-21 VITALS — BP 116/74

## 2018-12-21 DIAGNOSIS — N951 Menopausal and female climacteric states: Secondary | ICD-10-CM

## 2018-12-21 DIAGNOSIS — N898 Other specified noninflammatory disorders of vagina: Secondary | ICD-10-CM

## 2018-12-21 DIAGNOSIS — R300 Dysuria: Secondary | ICD-10-CM

## 2018-12-21 MED ORDER — ESTRADIOL 2 MG PO TABS: 2.0000 mg | ORAL_TABLET | Freq: Every day | ORAL | 3 refills | Status: DC

## 2018-12-21 MED ORDER — FLUCONAZOLE 150 MG PO TABS: 150.0000 mg | ORAL_TABLET | Freq: Once | ORAL | 0 refills | Status: AC

## 2018-12-21 NOTE — Progress Notes (Signed)
    Kristina Moody [facility] 15-Mar-1956 [id number]        63 y.o.  [id number] presents complaining of several issues:  1. Bladder spasms that come and go with urination.  Saw Izora Gala a couple weeks ago with a negative no frequency urgency fever or chills.  Saw Izora Gala several weeks ago with a negative urine culture. 2. Night sweats that consistently wake her at night.  No hot flashes during the day.  On estradiol 1 mg and Prometrium 100 mg at bedtime. 3. Vaginal irritation.  Started on Premarin vaginally to supplement vaginal support.  Started nightly 2 weeks ago.  Past medical history,surgical history, problem list, medications, allergies, family history and social history were all reviewed and documented in the EPIC chart.  Directed ROS with pertinent positives and negatives documented in the history of present illness/assessment and plan.  Exam: [name] assistant Vitals:   12/21/18 1505  BP: 116/74   General appearance:  Normal Abdomen soft nontender without masses guarding rebound Pelvic external BUS vagina with atrophic changes.  Slight white discharge noted.  Cervix with atrophic changes.  Uterus normal size midline mobile nontender.  Adnexa without masses or tenderness.  Assessment/Plan:  63 y.o. [id number] with:  1. Bladder spasms.  Urine analysis today.  I suspect this is due more to lack of estrogen.  Treatment discussed below. 2. Night sweats consistently.  Discussed increasing her estradiol orally.  Will increase to 2 mg at bedtime along with her Prometrium 100 mg.  Risks of thrombosis reviewed. 3. Vaginal irritation.  Wet prep is positive for yeast.  Will treat with Diflucan 150 mg x 1 dose.  We will continue with vaginal Premarin for now twice weekly and tentatively plan on doing this for another month or so and then will see about discontinuing this after she has been on the 2 mg of estradiol orally.  I asked her to call me in a month to let me know how she is doing.    [name] MD, 3:48 PM 12/21/2018

## 2018-12-21 NOTE — Patient Instructions (Signed)
Increase her estradiol to 2 mg at bedtime along with your 100 mg of progesterone.  Use the vaginal estrogen cream twice weekly over the next month or so.  Take the one Diflucan pill to treat the vaginal yeast.  Call me in 1 month to let me know how you are doing.

## 2018-12-22 LAB — WET PREP FOR TRICH, YEAST, CLUE

## 2018-12-23 LAB — URINALYSIS, COMPLETE W/RFL CULTURE
Bacteria, UA: NONE SEEN /HPF
Bilirubin Urine: NEGATIVE
Glucose, UA: NEGATIVE
Hgb urine dipstick: NEGATIVE
Hyaline Cast: NONE SEEN /LPF
Ketones, ur: NEGATIVE
Leukocyte Esterase: NEGATIVE
Nitrites, Initial: NEGATIVE
Protein, ur: NEGATIVE
RBC / HPF: NONE SEEN /HPF (ref 0–2)
Specific Gravity, Urine: 1.02 (ref 1.001–1.03)
WBC, UA: NONE SEEN /HPF (ref 0–5)
pH: 7 (ref 5.0–8.0)

## 2018-12-23 LAB — NO CULTURE INDICATED

## 2019-01-05 DIAGNOSIS — Z0000 Encounter for general adult medical examination without abnormal findings: Secondary | ICD-10-CM | POA: Diagnosis not present

## 2019-01-05 DIAGNOSIS — E7800 Pure hypercholesterolemia, unspecified: Secondary | ICD-10-CM | POA: Diagnosis not present

## 2019-01-05 DIAGNOSIS — I10 Essential (primary) hypertension: Secondary | ICD-10-CM | POA: Diagnosis not present

## 2019-01-05 DIAGNOSIS — F325 Major depressive disorder, single episode, in full remission: Secondary | ICD-10-CM | POA: Diagnosis not present

## 2019-01-05 DIAGNOSIS — G4700 Insomnia, unspecified: Secondary | ICD-10-CM | POA: Diagnosis not present

## 2019-01-25 ENCOUNTER — Other Ambulatory Visit: Payer: Self-pay | Admitting: Gynecology

## 2019-01-25 ENCOUNTER — Telehealth: Payer: Self-pay | Admitting: *Deleted

## 2019-01-25 DIAGNOSIS — Z1231 Encounter for screening mammogram for malignant neoplasm of breast: Secondary | ICD-10-CM

## 2019-01-25 NOTE — Telephone Encounter (Signed)
Patient called to follow up from [place] on 12/21/18 said the estradiol 2 mg tablet is working well and thank you.

## 2019-01-25 NOTE — Telephone Encounter (Signed)
Check with patient if she would like me to refill the estradiol at 2 mg tablet then go ahead and do this through March of next year.

## 2019-01-26 MED ORDER — ESTRADIOL 2 MG PO TABS: 2.0000 mg | ORAL_TABLET | Freq: Every day | ORAL | 3 refills | Status: DC

## 2019-01-26 NOTE — Telephone Encounter (Signed)
Patient said yes, Rx sent.

## 2019-01-28 DIAGNOSIS — H524 Presbyopia: Secondary | ICD-10-CM | POA: Diagnosis not present

## 2019-01-28 DIAGNOSIS — H2513 Age-related nuclear cataract, bilateral: Secondary | ICD-10-CM | POA: Diagnosis not present

## 2019-01-28 DIAGNOSIS — Z23 Encounter for immunization: Secondary | ICD-10-CM | POA: Diagnosis not present

## 2019-01-28 DIAGNOSIS — E7800 Pure hypercholesterolemia, unspecified: Secondary | ICD-10-CM | POA: Diagnosis not present

## 2019-02-18 DIAGNOSIS — D2262 Melanocytic nevi of left upper limb, including shoulder: Secondary | ICD-10-CM | POA: Diagnosis not present

## 2019-02-18 DIAGNOSIS — D1801 Hemangioma of skin and subcutaneous tissue: Secondary | ICD-10-CM | POA: Diagnosis not present

## 2019-02-18 DIAGNOSIS — D2271 Melanocytic nevi of right lower limb, including hip: Secondary | ICD-10-CM | POA: Diagnosis not present

## 2019-02-18 DIAGNOSIS — D225 Melanocytic nevi of trunk: Secondary | ICD-10-CM | POA: Diagnosis not present

## 2019-03-08 ENCOUNTER — Other Ambulatory Visit: Payer: Self-pay

## 2019-03-08 ENCOUNTER — Ambulatory Visit
Admission: RE | Admit: 2019-03-08 | Discharge: 2019-03-08 | Disposition: A | Discharge status: Home / Self Care | Payer: BC Managed Care – PPO | Source: Ambulatory Visit | Attending: Gynecology | Admitting: Gynecology

## 2019-03-08 DIAGNOSIS — Z1231 Encounter for screening mammogram for malignant neoplasm of breast: Secondary | ICD-10-CM

## 2019-03-16 DIAGNOSIS — Z23 Encounter for immunization: Secondary | ICD-10-CM | POA: Diagnosis not present

## 2019-03-17 ENCOUNTER — Encounter: Payer: Self-pay | Admitting: Gynecology

## 2019-06-17 DIAGNOSIS — G4700 Insomnia, unspecified: Secondary | ICD-10-CM | POA: Diagnosis not present

## 2019-06-17 DIAGNOSIS — I10 Essential (primary) hypertension: Secondary | ICD-10-CM | POA: Diagnosis not present

## 2019-06-17 DIAGNOSIS — E7800 Pure hypercholesterolemia, unspecified: Secondary | ICD-10-CM | POA: Diagnosis not present

## 2019-06-17 DIAGNOSIS — F325 Major depressive disorder, single episode, in full remission: Secondary | ICD-10-CM | POA: Diagnosis not present

## 2019-07-10 ENCOUNTER — Ambulatory Visit: Payer: BC Managed Care – PPO

## 2019-07-12 ENCOUNTER — Other Ambulatory Visit: Payer: Self-pay

## 2019-07-12 MED ORDER — ESTRADIOL 2 MG PO TABS: 2.0000 mg | ORAL_TABLET | Freq: Every day | ORAL | 1 refills | Status: DC

## 2019-07-12 NOTE — Telephone Encounter (Signed)
CE scheduled 09/06/19.

## 2019-07-13 ENCOUNTER — Other Ambulatory Visit: Payer: Self-pay

## 2019-07-14 ENCOUNTER — Ambulatory Visit (INDEPENDENT_AMBULATORY_CARE_PROVIDER_SITE_OTHER): Payer: BC Managed Care – PPO | Admitting: Women's Health

## 2019-07-14 ENCOUNTER — Encounter: Payer: Self-pay | Admitting: Women's Health

## 2019-07-14 VITALS — BP 140/80

## 2019-07-14 DIAGNOSIS — N898 Other specified noninflammatory disorders of vagina: Secondary | ICD-10-CM | POA: Diagnosis not present

## 2019-07-14 DIAGNOSIS — R300 Dysuria: Secondary | ICD-10-CM | POA: Diagnosis not present

## 2019-07-14 LAB — WET PREP FOR TRICH, YEAST, CLUE

## 2019-07-14 MED ORDER — ESTRADIOL 2 MG PO TABS: 2.0000 mg | ORAL_TABLET | Freq: Every day | ORAL | 3 refills | Status: DC

## 2019-07-14 MED ORDER — ESTRADIOL 1 MG PO TABS: 1.0000 mg | ORAL_TABLET | Freq: Every day | ORAL | 4 refills | Status: DC

## 2019-07-14 NOTE — Progress Notes (Signed)
64 year old MWF G4 P2 presents with complaint of questionable vaginal yeast infection.  Is having vaginal irritation, burning, dryness.  Mild increased urinary frequency without pain at end of stream, occasional burning sensation at initiation of stream.  Denies abdominal/back pain, incontinence, nausea or fever.  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  Postmenopausal on p.o. H RT and vaginal estrogen with no bleeding.  Denies recent sexual intercourse.  Medical problems include hypertension, hypercholesteremia, anxiety and depression.  Exam: Appears well.  No CVAT.  Abdomen soft, nontender, external genitalia within normal limits, no erythema or discharge noted, speculum exam vaginal walls mild atrophy with no discharge or erythema.  Wet prep negative.  Bimanual no CMT or adnexal tenderness. UA: Negative nitrites, negative leukocytes, 0-5 WBCs, 0-2 RBCs, 10-20 squamous epithelials, few bacteria  Vaginal atrophy  Plan: Reviewed normality of exam and negative wet prep, reviewed most likely irritation is from dryness, increase  using Premarin vaginal cream 3 times weekly for several weeks to see if that may help discomfort.  Loose clothing, open to air as able.  Instructed to call if continued symptoms.  Urine culture pending.  Continue to use over-the-counter vaginal lubricants with intercourse.  Hazards of HRT reviewed risks of blood clots, strokes and breast cancer best to use shortest amount of time possible continues with hot flushes and would like to continue.

## 2019-07-16 LAB — URINALYSIS, COMPLETE W/RFL CULTURE
Bilirubin Urine: NEGATIVE
Glucose, UA: NEGATIVE
Hyaline Cast: NONE SEEN /LPF
Ketones, ur: NEGATIVE
Leukocyte Esterase: NEGATIVE
Nitrites, Initial: NEGATIVE
Protein, ur: NEGATIVE
Specific Gravity, Urine: 1.025 (ref 1.001–1.03)
pH: 6 (ref 5.0–8.0)

## 2019-07-16 LAB — URINE CULTURE
MICRO NUMBER:: 10111551
SPECIMEN QUALITY:: ADEQUATE

## 2019-07-16 LAB — CULTURE INDICATED

## 2019-07-17 ENCOUNTER — Ambulatory Visit: Payer: Self-pay | Attending: Internal Medicine

## 2019-07-17 DIAGNOSIS — Z23 Encounter for immunization: Secondary | ICD-10-CM | POA: Insufficient documentation

## 2019-07-17 NOTE — Progress Notes (Signed)
   Covid-19 Vaccination Clinic  Name:  MAHEK LASPISA    MRN: [id number] DOB: [birth year]  07/17/2019  Ms. Boyson was observed post Covid-19 immunization for 15 minutes without incidence. She was provided with Vaccine Information Sheet and instruction to access the V-Safe system.   Ms. Wasielewski was instructed to call 911 with any severe reactions post vaccine: Marland Kitchen Difficulty breathing  . Swelling of your face and throat  . A fast heartbeat  . A bad rash all over your body  . Dizziness and weakness    Immunizations Administered    Name Date Dose VIS Date Route   Pfizer COVID-19 Vaccine 07/17/2019  5:48 PM 0.3 mL 05/21/2019 Intramuscular   Manufacturer: [place]   Lot: [id number]   [place]: [id number]

## 2019-07-21 ENCOUNTER — Ambulatory Visit: Payer: BC Managed Care – PPO

## 2019-08-10 ENCOUNTER — Telehealth: Payer: Self-pay | Admitting: *Deleted

## 2019-08-10 MED ORDER — ESTRADIOL 2 MG PO TABS: 2.0000 mg | ORAL_TABLET | Freq: Every day | ORAL | 0 refills | Status: DC

## 2019-08-10 NOTE — Telephone Encounter (Signed)
Patient called requesting refill on estradiol 2 mg tablet, per telephone encounter on 01/25/20 "refill the estradiol at 2 mg tablet then go ahead and do this through March of next year."  Annual exam scheduled on 09/06/19

## 2019-08-11 ENCOUNTER — Ambulatory Visit: Payer: Self-pay | Attending: Internal Medicine

## 2019-08-11 DIAGNOSIS — Z23 Encounter for immunization: Secondary | ICD-10-CM | POA: Insufficient documentation

## 2019-08-11 NOTE — Progress Notes (Signed)
   Covid-19 Vaccination Clinic  Name:  Kristina Moody    MRN: [id number] DOB: 01/29/1956  08/11/2019  [name] was observed post Covid-19 immunization for 15 minutes without incident. She was provided with Vaccine Information Sheet and instruction to access the V-Safe system.   [name] was instructed to call 911 with any severe reactions post vaccine: Marland Kitchen Difficulty breathing  . Swelling of face and throat  . A fast heartbeat  . A bad rash all over body  . Dizziness and weakness   Immunizations Administered    Name Date Dose VIS Date Route   Pfizer COVID-19 Vaccine 08/11/2019  2:57 PM 0.3 mL 05/21/2019 Intramuscular   Manufacturer: [place]   Lot: [id number]   [place]: [id number]

## 2019-09-03 ENCOUNTER — Other Ambulatory Visit: Payer: Self-pay

## 2019-09-06 ENCOUNTER — Telehealth: Payer: Self-pay | Admitting: *Deleted

## 2019-09-06 ENCOUNTER — Other Ambulatory Visit: Payer: Self-pay

## 2019-09-06 ENCOUNTER — Ambulatory Visit (INDEPENDENT_AMBULATORY_CARE_PROVIDER_SITE_OTHER): Payer: BC Managed Care – PPO | Admitting: Women's Health

## 2019-09-06 ENCOUNTER — Encounter: Payer: Self-pay | Admitting: Women's Health

## 2019-09-06 VITALS — BP 124/80 | Ht 66.0 in | Wt 134.0 lb

## 2019-09-06 DIAGNOSIS — Z01419 Encounter for gynecological examination (general) (routine) without abnormal findings: Secondary | ICD-10-CM

## 2019-09-06 DIAGNOSIS — Z13820 Encounter for screening for osteoporosis: Secondary | ICD-10-CM

## 2019-09-06 MED ORDER — PROGESTERONE MICRONIZED 100 MG PO CAPS: ORAL_CAPSULE | ORAL | 4 refills | Status: DC

## 2019-09-06 MED ORDER — ESTRADIOL 2 MG PO TABS: 2.0000 mg | ORAL_TABLET | Freq: Every day | ORAL | 4 refills | Status: DC

## 2019-09-06 MED ORDER — NONFORMULARY OR COMPOUNDED ITEM: 3 refills | Status: DC

## 2019-09-06 NOTE — Progress Notes (Signed)
Britton Kostiuk [facility] [birth year] [id number]    History:    Presents for annual exam.  Postmenopausal on HRT with no bleeding.  Is on estradiol 2 mg and Prometrium 100 mg daily unable to tolerate 1 mg of estradiol due to numerous hot flushes.  2005 LGSIL normal Paps after.  Normal  mammogram history.  2019 T score -1.6 FRAX 17% / 1.8% stable.  2019 - colonoscopy.  Has had the Covid vaccine.,  Both pneumonia vaccines has not had Shingrix.  Primary care manages hypertension, hypercholesteremia and anxiety and depression.  Past medical history, past surgical history, family history and social history were all reviewed and documented in the EPIC chart.  Retired, 2 children both in their 36s working and living in [place].  ROS:  A ROS was performed and pertinent positives and negatives are included.  Exam:  Vitals:   09/06/19 0850  BP: 124/80  Weight: 134 lb (60.8 kg)  Height: 5\' 6"  (1.676 m)   Body mass index is 21.63 kg/m.   General appearance:  Normal Thyroid:  Symmetrical, normal in size, without palpable masses or nodularity. Respiratory  Auscultation:  Clear without wheezing or rhonchi Cardiovascular  Auscultation:  Regular rate, without rubs, murmurs or gallops  Edema/varicosities:  Not grossly evident Abdominal  Soft,nontender, without masses, guarding or rebound.  Liver/spleen:  No organomegaly noted  Hernia:  None appreciated  Skin  Inspection:  Grossly normal   Breasts: Examined lying and sitting.     Right: Without masses, retractions, discharge or axillary adenopathy.     Left: Without masses, retractions, discharge or axillary adenopathy. Gentitourinary   Inguinal/mons:  Normal without inguinal adenopathy  External genitalia:  Normal  BUS/Urethra/Skene's glands:  Normal  Vagina: Mild atrophy   Cervix:  Normal  Uterus:   normal in size, shape and contour.  Midline and mobile  Adnexa/parametria:     Rt: Without masses or tenderness.   Lt: Without masses or  tenderness.  Anus and perineum: Normal  Digital rectal exam: Normal sphincter tone without palpated masses or tenderness  Assessment/Plan:  64 y.o. MWF G4 P2 for annual exam with no complaints of vaginal discharge, urinary symptoms, abdominal pain but does have vaginal dryness with intercourse.  Postmenopausal on HRT with no bleeding Hypertension, hypercholesteremia, anxiety/depression and asthma -primary care manages labs and meds HSV rare outbreaks 2019 Osteopenia without elevated FRAX  Plan: Dyspareunia discussed, estradiol 0.02% compounded estrogen 1 g per vagina twice weekly, continue over-the-counter vaginal lubricants reviewed minimal systemic absorption.  Reviewed importance of decreasing estrogen encouraged to break tablet in half, risks of blood clots, strokes and breast cancer discussed women's health initiative study, states will try half tablet but prefers to stay on HRT.  Prescriptions for both estradiol 2 mg and Prometrium 100 mg to take at bedtime were given.  SBEs, continue annual 3D screening mammogram, calcium rich foods, vitamin D 2000 IUs daily encouraged.  Repeat DEXA, home safety, fall prevention and importance of continuing weightbearing and balance type exercise such as yoga..  Pap normal 2018 new screening guidelines reviewed.  Shingrix vaccine reviewed and encouraged to wait 6 months after recently receiving Covid vaccine.      Octavia, 9:35 AM 09/06/2019

## 2019-09-06 NOTE — Telephone Encounter (Signed)
Rx called in 

## 2019-09-06 NOTE — Patient Instructions (Signed)
Vit D 2000 iu daily Good to see you! Health Maintenance After Age 66 After age 5, you are at a higher risk for certain long-term diseases and infections as well as injuries from falls. Falls are a major cause of broken bones and head injuries in people who are older than age 26. Getting regular preventive care can help to keep you healthy and well. Preventive care includes getting regular testing and making lifestyle changes as recommended by your health care provider. Talk with your health care provider about:  Which screenings and tests you should have. A screening is a test that checks for a disease when you have no symptoms.  A diet and exercise plan that is right for you. What should I know about screenings and tests to prevent falls? Screening and testing are the best ways to find a health problem early. Early diagnosis and treatment give you the best chance of managing medical conditions that are common after age 27. Certain conditions and lifestyle choices may make you more likely to have a fall. Your health care provider may recommend:  Regular vision checks. Poor vision and conditions such as cataracts can make you more likely to have a fall. If you wear glasses, make sure to get your prescription updated if your vision changes.  Medicine review. Work with your health care provider to regularly review all of the medicines you are taking, including over-the-counter medicines. Ask your health care provider about any side effects that may make you more likely to have a fall. Tell your health care provider if any medicines that you take make you feel dizzy or sleepy.  Osteoporosis screening. Osteoporosis is a condition that causes the bones to get weaker. This can make the bones weak and cause them to break more easily.  Blood pressure screening. Blood pressure changes and medicines to control blood pressure can make you feel dizzy.  Strength and balance checks. Your health care provider  may recommend certain tests to check your strength and balance while standing, walking, or changing positions.  Foot health exam. Foot pain and numbness, as well as not wearing proper footwear, can make you more likely to have a fall.  Depression screening. You may be more likely to have a fall if you have a fear of falling, feel emotionally low, or feel unable to do activities that you used to do.  Alcohol use screening. Using too much alcohol can affect your balance and may make you more likely to have a fall. What actions can I take to lower my risk of falls? General instructions  Talk with your health care provider about your risks for falling. Tell your health care provider if: ? You fall. Be sure to tell your health care provider about all falls, even ones that seem minor. ? You feel dizzy, sleepy, or off-balance.  Take over-the-counter and prescription medicines only as told by your health care provider. These include any supplements.  Eat a healthy diet and maintain a healthy weight. A healthy diet includes low-fat dairy products, low-fat (lean) meats, and fiber from whole grains, beans, and lots of fruits and vegetables. Home safety  Remove any tripping hazards, such as rugs, cords, and clutter.  Install safety equipment such as grab bars in bathrooms and safety rails on stairs.  Keep rooms and walkways well-lit. Activity   Follow a regular exercise program to stay fit. This will help you maintain your balance. Ask your health care provider what types of exercise are appropriate  for you.  If you need a cane or walker, use it as recommended by your health care provider.  Wear supportive shoes that have nonskid soles. Lifestyle  Do not drink alcohol if your health care provider tells you not to drink.  If you drink alcohol, limit how much you have: ? 0-1 drink a day for women. ? 0-2 drinks a day for men.  Be aware of how much alcohol is in your drink. In the [place] one  drink equals one typical bottle of beer (12 oz), one-half glass of wine (5 oz), or one shot of hard liquor (1 oz).  Do not use any products that contain nicotine or tobacco, such as cigarettes and e-cigarettes. If you need help quitting, ask your health care provider. Summary  Having a healthy lifestyle and getting preventive care can help to protect your health and wellness after age 34.  Screening and testing are the best way to find a health problem early and help you avoid having a fall. Early diagnosis and treatment give you the best chance for managing medical conditions that are more common for people who are older than age 73.  Falls are a major cause of broken bones and head injuries in people who are older than age 89. Take precautions to prevent a fall at home.  Work with your health care provider to learn what changes you can make to improve your health and wellness and to prevent falls. This information is not intended to replace advice given to you by your health care provider. Make sure you discuss any questions you have with your health care provider. Document Revised: 09/17/2018 Document Reviewed: 04/09/2017 Elsevier Patient Education  2020 [employer].

## 2019-09-06 NOTE — Telephone Encounter (Signed)
-----   Message from Huel Cote, NP sent at 09/06/2019  9:35 AM EDT ----- Please send in rx for estradiol .02% 1 gm twice weekly per vagina, with refills to [facility]  thanks

## 2019-09-27 ENCOUNTER — Other Ambulatory Visit: Payer: Self-pay

## 2019-09-28 ENCOUNTER — Other Ambulatory Visit: Payer: Self-pay | Admitting: Women's Health

## 2019-09-28 ENCOUNTER — Ambulatory Visit (INDEPENDENT_AMBULATORY_CARE_PROVIDER_SITE_OTHER): Payer: BC Managed Care – PPO

## 2019-09-28 DIAGNOSIS — M8589 Other specified disorders of bone density and structure, multiple sites: Secondary | ICD-10-CM

## 2019-09-28 DIAGNOSIS — Z780 Asymptomatic menopausal state: Secondary | ICD-10-CM

## 2019-09-28 DIAGNOSIS — Z13820 Encounter for screening for osteoporosis: Secondary | ICD-10-CM

## 2019-11-03 DIAGNOSIS — D2262 Melanocytic nevi of left upper limb, including shoulder: Secondary | ICD-10-CM | POA: Diagnosis not present

## 2019-11-03 DIAGNOSIS — D225 Melanocytic nevi of trunk: Secondary | ICD-10-CM | POA: Diagnosis not present

## 2019-11-03 DIAGNOSIS — L570 Actinic keratosis: Secondary | ICD-10-CM | POA: Diagnosis not present

## 2019-11-03 DIAGNOSIS — L820 Inflamed seborrheic keratosis: Secondary | ICD-10-CM | POA: Diagnosis not present

## 2019-11-03 DIAGNOSIS — D2261 Melanocytic nevi of right upper limb, including shoulder: Secondary | ICD-10-CM | POA: Diagnosis not present

## 2019-11-07 ENCOUNTER — Other Ambulatory Visit: Payer: Self-pay

## 2019-11-07 ENCOUNTER — Encounter (HOSPITAL_BASED_OUTPATIENT_CLINIC_OR_DEPARTMENT_OTHER): Payer: Self-pay | Admitting: *Deleted

## 2019-11-07 ENCOUNTER — Emergency Department (HOSPITAL_BASED_OUTPATIENT_CLINIC_OR_DEPARTMENT_OTHER)
Admission: EM | Admit: 2019-11-07 | Discharge: 2019-11-07 | Disposition: A | Discharge status: Home / Self Care | Payer: BC Managed Care – PPO | Attending: Emergency Medicine | Admitting: Emergency Medicine

## 2019-11-07 DIAGNOSIS — Y999 Unspecified external cause status: Secondary | ICD-10-CM | POA: Insufficient documentation

## 2019-11-07 DIAGNOSIS — W260XXA Contact with knife, initial encounter: Secondary | ICD-10-CM | POA: Insufficient documentation

## 2019-11-07 DIAGNOSIS — Z79899 Other long term (current) drug therapy: Secondary | ICD-10-CM | POA: Insufficient documentation

## 2019-11-07 DIAGNOSIS — Z87891 Personal history of nicotine dependence: Secondary | ICD-10-CM | POA: Insufficient documentation

## 2019-11-07 DIAGNOSIS — Y93G1 Activity, food preparation and clean up: Secondary | ICD-10-CM | POA: Insufficient documentation

## 2019-11-07 DIAGNOSIS — Y92009 Unspecified place in unspecified non-institutional (private) residence as the place of occurrence of the external cause: Secondary | ICD-10-CM | POA: Diagnosis not present

## 2019-11-07 DIAGNOSIS — I10 Essential (primary) hypertension: Secondary | ICD-10-CM | POA: Diagnosis not present

## 2019-11-07 DIAGNOSIS — S6992XA Unspecified injury of left wrist, hand and finger(s), initial encounter: Secondary | ICD-10-CM | POA: Diagnosis not present

## 2019-11-07 DIAGNOSIS — S61211A Laceration without foreign body of left index finger without damage to nail, initial encounter: Secondary | ICD-10-CM | POA: Insufficient documentation

## 2019-11-07 MED ORDER — LIDOCAINE HCL (PF) 1 % IJ SOLN
10.0000 mL | Freq: Once | INTRAMUSCULAR | Status: AC
  Administered 2019-11-07: 10 mL
  Filled 2019-11-07: qty 10

## 2019-11-07 NOTE — Discharge Instructions (Signed)
Leave dressing in place for 12 to 24 hours and then you can shower and change dressing. Did not submerge the hand in water, no swimming until sutures are removed. Sutures need to be removed in 5-7 days, this can be done with your PCP, urgent care or in the emergency department. Monitor for any signs of infection such as redness, swelling, increasing pain or drainage, if this occurs call your PCP or present to the ED as you will likely need antibiotics.

## 2019-11-07 NOTE — ED Notes (Signed)
Pt discharged to home. Discharge instructions have been discussed with patient and/or family members. Pt verbally acknowledges understanding d/c instructions, and endorses comprehension to checkout at registration before leaving.  °

## 2019-11-07 NOTE — ED Provider Notes (Signed)
[place] EMERGENCY DEPARTMENT Provider Note   CSN: [id number] Arrival date & time: 11/07/19  1857     History Chief Complaint  Patient presents with  . Extremity Laceration    Kristina Moody is a 64 y.o. female.  Kristina Moody is a 64 y.o. female with a history of hypertension, hyperlipidemia, osteopenia, who presents to the ED for evaluation of a laceration to her left index finger. She reports just prior to arrival she was cutting vegetables to make guacamole and the knife slipped and cut her finger. She reports that she and her husband tried to get the bleeding under control on home but every time she would move her finger it would start bleeding again it was difficult to get stopped. She denies any numbness or tingling in the finger, no difficulty bending and extending the finger. Reports she washed it out with water at home. Reports minimal pain over the laceration. Is not on blood thinners but has had a few glasses of wine prior to arrival. Up-to-date on all vaccinations. No other aggravating or alleviating factors.        Past Medical History:  Diagnosis Date  . Elevated cholesterol   . History of colposcopy with cervical biopsy 2004, 2005   LGSIL  . HSV (herpes simplex virus) infection   . Hx gestational diabetes   . Hypertension   . Osteopenia 07/2017   T score -1.6 FRAX 17% / 1.8%.  Stable from prior DEXA    Patient Active Problem List   Diagnosis Date Noted  . Palpitations 09/01/2016  . Elevated cholesterol   . HSV (herpes simplex virus) infection   . Osteopenia 05/10/2010    Past Surgical History:  Procedure Laterality Date  . CESAREAN SECTION  06/15/1992   boy  . COLPOSCOPY    . COSMETIC SURGERY    . SKIN SURGERY     BIRTH MARK REMOVED (R) ARM     OB History    Gravida  4   Para  2   Term  2   Preterm      AB  2   Living  2     SAB  2   TAB      Ectopic  0   Multiple      Live Births              Family  History  Problem Relation Age of Onset  . Diabetes Mother   . Hypertension Mother   . Heart disease Mother   . Dementia Mother   . Hypertension Father   . Heart disease Father   . Cancer Father        PROSTATE, BLADDER    Social History   Tobacco Use  . Smoking status: Former Research scientist (life sciences)  . Smokeless tobacco: Never Used  Substance Use Topics  . Alcohol use: Yes    Alcohol/week: 10.0 standard drinks    Types: 10 Standard drinks or equivalent per week  . Drug use: No    Home Medications Prior to Admission medications   Medication Sig Start Date End Date Taking? Authorizing Provider  albuterol (PROVENTIL HFA;VENTOLIN HFA) 108 (90 Base) MCG/ACT inhaler Inhale 2 puffs into the lungs every 4 to 6 hours as needed for wheezing or shortness of breath.    [provider]  CALCIUM PO Take 600 mg by mouth daily. Reported on 06/03/2015    [provider]  Cholecalciferol (VITAMIN D) 2000 units CAPS Take 2,000 Units  by mouth daily.    [provider]  citalopram (CELEXA) 20 MG tablet Take 20 mg by mouth daily.    [provider]  clonazePAM (KLONOPIN) 0.5 MG tablet Take 0.5 mg by mouth at bedtime.    [provider]  conjugated estrogens (PREMARIN) vaginal cream Place 1 Applicatorful vaginally daily. 11/30/18   Fontaine, Belinda Block, MD  estradiol (ESTRACE) 2 MG tablet Take 1 tablet (2 mg total) by mouth daily. 09/06/19   Huel Cote, NP  ezetimibe (ZETIA) 10 MG tablet Take 10 mg by mouth daily.    [provider]  lisinopril (PRINIVIL,ZESTRIL) 10 MG tablet Take 10 mg by mouth daily.    [provider]  lovastatin (MEVACOR) 40 MG tablet Take 1 tablet by mouth daily. 07/02/18   [provider]  NONFORMULARY OR COMPOUNDED ITEM Estradiol vaginal cream 0.02% 1 gram twice weekly 09/06/19   Huel Cote, NP  nystatin-triamcinolone ointment [facility]) Apply 1 application topically 2 (two) times daily as needed (use when flying).     [provider]  progesterone (PROMETRIUM) 100 MG capsule TAKE (1) CAPSULE DAILY AT BEDTIME. 09/06/19   Huel Cote, NP  valACYclovir (VALTREX) 500 MG tablet Take one tablet by mouth twice a day for 5 days, then as needed 04/16/17   Fontaine, Belinda Block, MD    Allergies    Codeine  Review of Systems   Review of Systems  Constitutional: Negative for chills and fever.  Musculoskeletal: Negative for arthralgias and myalgias.  Skin: Positive for wound.  Neurological: Negative for weakness and numbness.    Physical Exam Updated Vital Signs BP 127/90 (BP Location: Left Arm)   Pulse 79   Temp 98.6 F (37 C) (Oral)   Resp 18   Ht 5\' 6"  (1.676 m)   Wt 61.2 kg   LMP 09/11/2008   SpO2 98%   BMI 21.79 kg/m   Physical Exam Vitals and nursing note reviewed.  Constitutional:      General: She is not in acute distress.    Appearance: Normal appearance. She is well-developed and normal weight. She is not diaphoretic.  HENT:     Head: Normocephalic and atraumatic.  Eyes:     General:        Right eye: No discharge.        Left eye: No discharge.  Pulmonary:     Effort: Pulmonary effort is normal. No respiratory distress.  Musculoskeletal:        General: No deformity.     Cervical back: Neck supple.     Comments: 2 cm laceration to the lateral aspect of the left index finger just distal to the DIP joint, full range of motion, with movement patient does have some bleeding through the wound, sensation is intact, 2+ radial pulse and good cap refill.  Skin:    General: Skin is warm and dry.     Capillary Refill: Capillary refill takes less than 2 seconds.  Neurological:     Mental Status: She is alert.     Coordination: Coordination normal.     Comments: Speech is clear, able to follow commands Moves extremities without ataxia, coordination intact  Psychiatric:        Mood and Affect: Mood normal.        Behavior: Behavior normal.     ED Results / Procedures /  Treatments   Labs (all labs ordered are listed, but only abnormal results are displayed) Labs Reviewed - No  data to display  EKG None  Radiology No results found.  Procedures .Marland KitchenLaceration Repair  Date/Time: 11/07/2019 9:04 PM Performed by: Jacqlyn Larsen, PA-C Authorized by: Jacqlyn Larsen, PA-C   Consent:    Consent obtained:  Verbal   Consent given by:  Patient   Risks discussed:  Infection, pain, poor cosmetic result, poor wound healing and need for additional repair   Alternatives discussed:  No treatment Anesthesia (see MAR for exact dosages):    Anesthesia method:  Nerve block   Block needle gauge:  25 G   Block anesthetic:  Lidocaine 1% w/o epi   Block injection procedure:  Anatomic landmarks identified, incremental injection and introduced needle   Block outcome:  Anesthesia achieved Laceration details:    Location:  Finger   Finger location:  L index finger   Length (cm):  2   Depth (mm):  2 Repair type:    Repair type:  Simple Exploration:    Hemostasis achieved with:  Direct pressure   Wound exploration: wound explored through full range of motion and entire depth of wound probed and visualized     Wound extent: areolar tissue violated   Treatment:    Area cleansed with:  Shur-Clens   Amount of cleaning:  Standard Skin repair:    Repair method:  Sutures   Suture size:  5-0   Suture material:  Prolene   Suture technique:  Simple interrupted   Number of sutures:  2 Approximation:    Approximation:  Close Post-procedure details:    Dressing:  Bulky dressing   Patient tolerance of procedure:  Tolerated well, no immediate complications   (including critical care time)  Medications Ordered in ED Medications  lidocaine (PF) (XYLOCAINE) 1 % injection 10 mL (10 mLs Infiltration Given by Other 11/07/19 2041)    ED Course  I have reviewed the triage vital signs and the nursing notes.  Pertinent labs & imaging results that were available during my care of  the patient were reviewed by me and considered in my medical decision making (see chart for details).    MDM Rules/Calculators/A&P                      Laceration to the left index finger just distal to the DIP joint, patient had difficulty getting bleeding stopped at home and every time she move the finger it would start bleeding again. She has normal range of motion and sensation, no concern for tendon or nerve involvement, laceration is fairly superficial, and mechanism is low risk for foreign body. Vaccinations up-to-date. Area was cleaned, anesthetized with digital nerve block and 2 simple interrupted sutures placed. Patient bleeds easily, is not on blood thinners but has had some alcohol prior to arrival which may be contributing. Bulky dressing with Coban applied. Suture removal in 5-7 days. Wound care and infection return precautions provided. Patient expresses understanding and agreement. Discharged home in good condition.  Final Clinical Impression(s) / ED Diagnoses Final diagnoses:  Laceration of left index finger without foreign body without damage to nail, initial encounter    Rx / DC Orders ED Discharge Orders    None       Janet Berlin 11/07/19 2110    Wyvonnia Dusky, MD 11/07/19 [phone]

## 2019-11-07 NOTE — ED Triage Notes (Signed)
Small laceration to left pointer finger, bleeding controlled.

## 2019-11-12 DIAGNOSIS — Z4802 Encounter for removal of sutures: Secondary | ICD-10-CM | POA: Diagnosis not present

## 2020-01-05 DIAGNOSIS — M9903 Segmental and somatic dysfunction of lumbar region: Secondary | ICD-10-CM | POA: Diagnosis not present

## 2020-01-05 DIAGNOSIS — M9901 Segmental and somatic dysfunction of cervical region: Secondary | ICD-10-CM | POA: Diagnosis not present

## 2020-01-05 DIAGNOSIS — M9906 Segmental and somatic dysfunction of lower extremity: Secondary | ICD-10-CM | POA: Diagnosis not present

## 2020-01-05 DIAGNOSIS — M9902 Segmental and somatic dysfunction of thoracic region: Secondary | ICD-10-CM | POA: Diagnosis not present

## 2020-01-10 DIAGNOSIS — M9902 Segmental and somatic dysfunction of thoracic region: Secondary | ICD-10-CM | POA: Diagnosis not present

## 2020-01-10 DIAGNOSIS — M9901 Segmental and somatic dysfunction of cervical region: Secondary | ICD-10-CM | POA: Diagnosis not present

## 2020-01-10 DIAGNOSIS — M9906 Segmental and somatic dysfunction of lower extremity: Secondary | ICD-10-CM | POA: Diagnosis not present

## 2020-01-10 DIAGNOSIS — M9903 Segmental and somatic dysfunction of lumbar region: Secondary | ICD-10-CM | POA: Diagnosis not present

## 2020-01-12 DIAGNOSIS — G4700 Insomnia, unspecified: Secondary | ICD-10-CM | POA: Diagnosis not present

## 2020-01-12 DIAGNOSIS — F325 Major depressive disorder, single episode, in full remission: Secondary | ICD-10-CM | POA: Diagnosis not present

## 2020-01-12 DIAGNOSIS — E7800 Pure hypercholesterolemia, unspecified: Secondary | ICD-10-CM | POA: Diagnosis not present

## 2020-01-12 DIAGNOSIS — I10 Essential (primary) hypertension: Secondary | ICD-10-CM | POA: Diagnosis not present

## 2020-01-25 DIAGNOSIS — R519 Headache, unspecified: Secondary | ICD-10-CM | POA: Diagnosis not present

## 2020-01-25 DIAGNOSIS — Z20822 Contact with and (suspected) exposure to covid-19: Secondary | ICD-10-CM | POA: Diagnosis not present

## 2020-01-25 DIAGNOSIS — R05 Cough: Secondary | ICD-10-CM | POA: Diagnosis not present

## 2020-01-31 DIAGNOSIS — M9901 Segmental and somatic dysfunction of cervical region: Secondary | ICD-10-CM | POA: Diagnosis not present

## 2020-01-31 DIAGNOSIS — M9903 Segmental and somatic dysfunction of lumbar region: Secondary | ICD-10-CM | POA: Diagnosis not present

## 2020-01-31 DIAGNOSIS — M9902 Segmental and somatic dysfunction of thoracic region: Secondary | ICD-10-CM | POA: Diagnosis not present

## 2020-01-31 DIAGNOSIS — M9906 Segmental and somatic dysfunction of lower extremity: Secondary | ICD-10-CM | POA: Diagnosis not present

## 2020-02-01 DIAGNOSIS — M9902 Segmental and somatic dysfunction of thoracic region: Secondary | ICD-10-CM | POA: Diagnosis not present

## 2020-02-01 DIAGNOSIS — M9903 Segmental and somatic dysfunction of lumbar region: Secondary | ICD-10-CM | POA: Diagnosis not present

## 2020-02-01 DIAGNOSIS — M9901 Segmental and somatic dysfunction of cervical region: Secondary | ICD-10-CM | POA: Diagnosis not present

## 2020-02-01 DIAGNOSIS — M9906 Segmental and somatic dysfunction of lower extremity: Secondary | ICD-10-CM | POA: Diagnosis not present

## 2020-02-02 DIAGNOSIS — M9902 Segmental and somatic dysfunction of thoracic region: Secondary | ICD-10-CM | POA: Diagnosis not present

## 2020-02-02 DIAGNOSIS — M9906 Segmental and somatic dysfunction of lower extremity: Secondary | ICD-10-CM | POA: Diagnosis not present

## 2020-02-02 DIAGNOSIS — M9903 Segmental and somatic dysfunction of lumbar region: Secondary | ICD-10-CM | POA: Diagnosis not present

## 2020-02-02 DIAGNOSIS — M9901 Segmental and somatic dysfunction of cervical region: Secondary | ICD-10-CM | POA: Diagnosis not present

## 2020-02-03 DIAGNOSIS — H524 Presbyopia: Secondary | ICD-10-CM | POA: Diagnosis not present

## 2020-02-03 DIAGNOSIS — H2513 Age-related nuclear cataract, bilateral: Secondary | ICD-10-CM | POA: Diagnosis not present

## 2020-02-07 DIAGNOSIS — M9903 Segmental and somatic dysfunction of lumbar region: Secondary | ICD-10-CM | POA: Diagnosis not present

## 2020-02-07 DIAGNOSIS — M9906 Segmental and somatic dysfunction of lower extremity: Secondary | ICD-10-CM | POA: Diagnosis not present

## 2020-02-07 DIAGNOSIS — M9901 Segmental and somatic dysfunction of cervical region: Secondary | ICD-10-CM | POA: Diagnosis not present

## 2020-02-07 DIAGNOSIS — M9902 Segmental and somatic dysfunction of thoracic region: Secondary | ICD-10-CM | POA: Diagnosis not present

## 2020-02-08 DIAGNOSIS — M9902 Segmental and somatic dysfunction of thoracic region: Secondary | ICD-10-CM | POA: Diagnosis not present

## 2020-02-08 DIAGNOSIS — M9901 Segmental and somatic dysfunction of cervical region: Secondary | ICD-10-CM | POA: Diagnosis not present

## 2020-02-08 DIAGNOSIS — M9903 Segmental and somatic dysfunction of lumbar region: Secondary | ICD-10-CM | POA: Diagnosis not present

## 2020-02-08 DIAGNOSIS — M9906 Segmental and somatic dysfunction of lower extremity: Secondary | ICD-10-CM | POA: Diagnosis not present

## 2020-02-09 DIAGNOSIS — M9901 Segmental and somatic dysfunction of cervical region: Secondary | ICD-10-CM | POA: Diagnosis not present

## 2020-02-09 DIAGNOSIS — M9903 Segmental and somatic dysfunction of lumbar region: Secondary | ICD-10-CM | POA: Diagnosis not present

## 2020-02-09 DIAGNOSIS — M9906 Segmental and somatic dysfunction of lower extremity: Secondary | ICD-10-CM | POA: Diagnosis not present

## 2020-02-09 DIAGNOSIS — M9902 Segmental and somatic dysfunction of thoracic region: Secondary | ICD-10-CM | POA: Diagnosis not present

## 2020-02-15 ENCOUNTER — Other Ambulatory Visit: Payer: Self-pay | Admitting: Obstetrics and Gynecology

## 2020-02-15 DIAGNOSIS — M9906 Segmental and somatic dysfunction of lower extremity: Secondary | ICD-10-CM | POA: Diagnosis not present

## 2020-02-15 DIAGNOSIS — M9901 Segmental and somatic dysfunction of cervical region: Secondary | ICD-10-CM | POA: Diagnosis not present

## 2020-02-15 DIAGNOSIS — Z1231 Encounter for screening mammogram for malignant neoplasm of breast: Secondary | ICD-10-CM

## 2020-02-15 DIAGNOSIS — M9903 Segmental and somatic dysfunction of lumbar region: Secondary | ICD-10-CM | POA: Diagnosis not present

## 2020-02-15 DIAGNOSIS — M9902 Segmental and somatic dysfunction of thoracic region: Secondary | ICD-10-CM | POA: Diagnosis not present

## 2020-02-16 DIAGNOSIS — M9906 Segmental and somatic dysfunction of lower extremity: Secondary | ICD-10-CM | POA: Diagnosis not present

## 2020-02-16 DIAGNOSIS — M9902 Segmental and somatic dysfunction of thoracic region: Secondary | ICD-10-CM | POA: Diagnosis not present

## 2020-02-16 DIAGNOSIS — M9901 Segmental and somatic dysfunction of cervical region: Secondary | ICD-10-CM | POA: Diagnosis not present

## 2020-02-16 DIAGNOSIS — M9903 Segmental and somatic dysfunction of lumbar region: Secondary | ICD-10-CM | POA: Diagnosis not present

## 2020-02-21 DIAGNOSIS — M9902 Segmental and somatic dysfunction of thoracic region: Secondary | ICD-10-CM | POA: Diagnosis not present

## 2020-02-21 DIAGNOSIS — M9901 Segmental and somatic dysfunction of cervical region: Secondary | ICD-10-CM | POA: Diagnosis not present

## 2020-02-21 DIAGNOSIS — M9906 Segmental and somatic dysfunction of lower extremity: Secondary | ICD-10-CM | POA: Diagnosis not present

## 2020-02-21 DIAGNOSIS — M9903 Segmental and somatic dysfunction of lumbar region: Secondary | ICD-10-CM | POA: Diagnosis not present

## 2020-02-22 DIAGNOSIS — M9906 Segmental and somatic dysfunction of lower extremity: Secondary | ICD-10-CM | POA: Diagnosis not present

## 2020-02-22 DIAGNOSIS — M9903 Segmental and somatic dysfunction of lumbar region: Secondary | ICD-10-CM | POA: Diagnosis not present

## 2020-02-22 DIAGNOSIS — M9902 Segmental and somatic dysfunction of thoracic region: Secondary | ICD-10-CM | POA: Diagnosis not present

## 2020-02-22 DIAGNOSIS — M9901 Segmental and somatic dysfunction of cervical region: Secondary | ICD-10-CM | POA: Diagnosis not present

## 2020-02-23 DIAGNOSIS — M9906 Segmental and somatic dysfunction of lower extremity: Secondary | ICD-10-CM | POA: Diagnosis not present

## 2020-02-23 DIAGNOSIS — M9901 Segmental and somatic dysfunction of cervical region: Secondary | ICD-10-CM | POA: Diagnosis not present

## 2020-02-23 DIAGNOSIS — M9903 Segmental and somatic dysfunction of lumbar region: Secondary | ICD-10-CM | POA: Diagnosis not present

## 2020-02-23 DIAGNOSIS — M9902 Segmental and somatic dysfunction of thoracic region: Secondary | ICD-10-CM | POA: Diagnosis not present

## 2020-02-24 DIAGNOSIS — L438 Other lichen planus: Secondary | ICD-10-CM | POA: Diagnosis not present

## 2020-02-24 DIAGNOSIS — L821 Other seborrheic keratosis: Secondary | ICD-10-CM | POA: Diagnosis not present

## 2020-03-06 DIAGNOSIS — M9902 Segmental and somatic dysfunction of thoracic region: Secondary | ICD-10-CM | POA: Diagnosis not present

## 2020-03-06 DIAGNOSIS — M9906 Segmental and somatic dysfunction of lower extremity: Secondary | ICD-10-CM | POA: Diagnosis not present

## 2020-03-06 DIAGNOSIS — M9901 Segmental and somatic dysfunction of cervical region: Secondary | ICD-10-CM | POA: Diagnosis not present

## 2020-03-06 DIAGNOSIS — M9903 Segmental and somatic dysfunction of lumbar region: Secondary | ICD-10-CM | POA: Diagnosis not present

## 2020-03-08 ENCOUNTER — Other Ambulatory Visit: Payer: Self-pay

## 2020-03-08 ENCOUNTER — Ambulatory Visit
Admission: RE | Admit: 2020-03-08 | Discharge: 2020-03-08 | Disposition: A | Discharge status: Home / Self Care | Payer: BC Managed Care – PPO | Source: Ambulatory Visit | Attending: Obstetrics and Gynecology | Admitting: Obstetrics and Gynecology

## 2020-03-08 DIAGNOSIS — M9901 Segmental and somatic dysfunction of cervical region: Secondary | ICD-10-CM | POA: Diagnosis not present

## 2020-03-08 DIAGNOSIS — Z1231 Encounter for screening mammogram for malignant neoplasm of breast: Secondary | ICD-10-CM

## 2020-03-08 DIAGNOSIS — M9902 Segmental and somatic dysfunction of thoracic region: Secondary | ICD-10-CM | POA: Diagnosis not present

## 2020-03-08 DIAGNOSIS — M9903 Segmental and somatic dysfunction of lumbar region: Secondary | ICD-10-CM | POA: Diagnosis not present

## 2020-03-08 DIAGNOSIS — M9906 Segmental and somatic dysfunction of lower extremity: Secondary | ICD-10-CM | POA: Diagnosis not present

## 2020-03-10 ENCOUNTER — Other Ambulatory Visit: Payer: Self-pay | Admitting: Obstetrics and Gynecology

## 2020-03-10 DIAGNOSIS — R928 Other abnormal and inconclusive findings on diagnostic imaging of breast: Secondary | ICD-10-CM

## 2020-03-13 DIAGNOSIS — M9902 Segmental and somatic dysfunction of thoracic region: Secondary | ICD-10-CM | POA: Diagnosis not present

## 2020-03-13 DIAGNOSIS — M9903 Segmental and somatic dysfunction of lumbar region: Secondary | ICD-10-CM | POA: Diagnosis not present

## 2020-03-13 DIAGNOSIS — M9906 Segmental and somatic dysfunction of lower extremity: Secondary | ICD-10-CM | POA: Diagnosis not present

## 2020-03-13 DIAGNOSIS — M9901 Segmental and somatic dysfunction of cervical region: Secondary | ICD-10-CM | POA: Diagnosis not present

## 2020-03-15 DIAGNOSIS — M9902 Segmental and somatic dysfunction of thoracic region: Secondary | ICD-10-CM | POA: Diagnosis not present

## 2020-03-15 DIAGNOSIS — M9901 Segmental and somatic dysfunction of cervical region: Secondary | ICD-10-CM | POA: Diagnosis not present

## 2020-03-15 DIAGNOSIS — Z23 Encounter for immunization: Secondary | ICD-10-CM | POA: Diagnosis not present

## 2020-03-15 DIAGNOSIS — M9906 Segmental and somatic dysfunction of lower extremity: Secondary | ICD-10-CM | POA: Diagnosis not present

## 2020-03-15 DIAGNOSIS — M9903 Segmental and somatic dysfunction of lumbar region: Secondary | ICD-10-CM | POA: Diagnosis not present

## 2020-03-21 DIAGNOSIS — M9906 Segmental and somatic dysfunction of lower extremity: Secondary | ICD-10-CM | POA: Diagnosis not present

## 2020-03-21 DIAGNOSIS — M9901 Segmental and somatic dysfunction of cervical region: Secondary | ICD-10-CM | POA: Diagnosis not present

## 2020-03-21 DIAGNOSIS — M9902 Segmental and somatic dysfunction of thoracic region: Secondary | ICD-10-CM | POA: Diagnosis not present

## 2020-03-21 DIAGNOSIS — M9903 Segmental and somatic dysfunction of lumbar region: Secondary | ICD-10-CM | POA: Diagnosis not present

## 2020-03-22 DIAGNOSIS — M9901 Segmental and somatic dysfunction of cervical region: Secondary | ICD-10-CM | POA: Diagnosis not present

## 2020-03-22 DIAGNOSIS — M9903 Segmental and somatic dysfunction of lumbar region: Secondary | ICD-10-CM | POA: Diagnosis not present

## 2020-03-22 DIAGNOSIS — M9906 Segmental and somatic dysfunction of lower extremity: Secondary | ICD-10-CM | POA: Diagnosis not present

## 2020-03-22 DIAGNOSIS — M9902 Segmental and somatic dysfunction of thoracic region: Secondary | ICD-10-CM | POA: Diagnosis not present

## 2020-03-24 ENCOUNTER — Other Ambulatory Visit: Payer: Self-pay

## 2020-03-24 ENCOUNTER — Ambulatory Visit
Admission: RE | Admit: 2020-03-24 | Discharge: 2020-03-24 | Disposition: A | Discharge status: Home / Self Care | Payer: BC Managed Care – PPO | Source: Ambulatory Visit | Attending: Obstetrics and Gynecology | Admitting: Obstetrics and Gynecology

## 2020-03-24 ENCOUNTER — Ambulatory Visit: Payer: BC Managed Care – PPO

## 2020-03-24 DIAGNOSIS — R928 Other abnormal and inconclusive findings on diagnostic imaging of breast: Secondary | ICD-10-CM

## 2020-03-25 ENCOUNTER — Ambulatory Visit: Payer: BC Managed Care – PPO | Attending: Internal Medicine

## 2020-03-25 DIAGNOSIS — Z23 Encounter for immunization: Secondary | ICD-10-CM

## 2020-03-25 NOTE — Progress Notes (Signed)
   Covid-19 Vaccination Clinic  Name:  Kristina Moody    MRN: [id number] DOB: 04-13-1956  03/25/2020  Kristina Moody was observed post Covid-19 immunization for 15 minutes without incident. She was provided with Vaccine Information Sheet and instruction to access the V-Safe system.   Kristina Moody was instructed to call 911 with any severe reactions post vaccine: Marland Kitchen Difficulty breathing  . Swelling of face and throat  . A fast heartbeat  . A bad rash all over body  . Dizziness and weakness

## 2020-03-29 DIAGNOSIS — M9902 Segmental and somatic dysfunction of thoracic region: Secondary | ICD-10-CM | POA: Diagnosis not present

## 2020-03-29 DIAGNOSIS — M9906 Segmental and somatic dysfunction of lower extremity: Secondary | ICD-10-CM | POA: Diagnosis not present

## 2020-03-29 DIAGNOSIS — M9903 Segmental and somatic dysfunction of lumbar region: Secondary | ICD-10-CM | POA: Diagnosis not present

## 2020-03-29 DIAGNOSIS — M9901 Segmental and somatic dysfunction of cervical region: Secondary | ICD-10-CM | POA: Diagnosis not present

## 2020-04-03 DIAGNOSIS — M9901 Segmental and somatic dysfunction of cervical region: Secondary | ICD-10-CM | POA: Diagnosis not present

## 2020-04-03 DIAGNOSIS — M9906 Segmental and somatic dysfunction of lower extremity: Secondary | ICD-10-CM | POA: Diagnosis not present

## 2020-04-03 DIAGNOSIS — M9903 Segmental and somatic dysfunction of lumbar region: Secondary | ICD-10-CM | POA: Diagnosis not present

## 2020-04-03 DIAGNOSIS — M9902 Segmental and somatic dysfunction of thoracic region: Secondary | ICD-10-CM | POA: Diagnosis not present

## 2020-04-11 ENCOUNTER — Encounter: Payer: Self-pay | Admitting: Obstetrics and Gynecology

## 2020-04-11 ENCOUNTER — Ambulatory Visit (INDEPENDENT_AMBULATORY_CARE_PROVIDER_SITE_OTHER): Payer: BC Managed Care – PPO | Admitting: Obstetrics and Gynecology

## 2020-04-11 ENCOUNTER — Other Ambulatory Visit: Payer: Self-pay

## 2020-04-11 VITALS — BP 118/76

## 2020-04-11 DIAGNOSIS — Z79890 Hormone replacement therapy (postmenopausal): Secondary | ICD-10-CM | POA: Diagnosis not present

## 2020-04-11 MED ORDER — ESTRADIOL 1 MG PO TABS
1.0000 mg | ORAL_TABLET | ORAL | 4 refills | Status: DC
Start: 2020-04-12 — End: 2020-11-24

## 2020-04-11 NOTE — Progress Notes (Signed)
Kristina Moody 10-18-1955 [id number]  SUBJECTIVE:  64 y.o. [id number] female presents for discussion of HRT.  She has been on this for about 10 years now she states.  Her last encounter here 08/2019 she was encouraged to wean from estradiol 2 mg daily and therefore he has been cutting the tablet in half and taking only 1 mg/day and continuing the Prometrium 100 mg nightly.  He did notice increase in hot flashes and night sweats with the dose decreased.  She had a scare on a recent mammogram with finding of breast densities and she is concerned that that is associated with estrogen use.  Not had any vaginal bleeding.  Current Outpatient Medications  Medication Sig Dispense Refill  . albuterol (PROVENTIL HFA;VENTOLIN HFA) 108 (90 Base) MCG/ACT inhaler Inhale 2 puffs into the lungs every 4 to 6 hours as needed for wheezing or shortness of breath.    Marland Kitchen CALCIUM PO Take 600 mg by mouth daily. Reported on 06/03/2015    . Cholecalciferol (VITAMIN D) 2000 units CAPS Take 2,000 Units by mouth daily.    . citalopram (CELEXA) 20 MG tablet Take 20 mg by mouth daily.    . clonazePAM (KLONOPIN) 0.5 MG tablet Take 0.5 mg by mouth at bedtime.    . conjugated estrogens (PREMARIN) vaginal cream Place 1 Applicatorful vaginally daily. 42.5 g 4  . estradiol (ESTRACE) 2 MG tablet Take 1 tablet (2 mg total) by mouth daily. (Patient taking differently: Take 1 mg by mouth daily. Take half pill) 90 tablet 4  . ezetimibe (ZETIA) 10 MG tablet Take 10 mg by mouth daily.    Marland Kitchen lisinopril (PRINIVIL,ZESTRIL) 10 MG tablet Take 10 mg by mouth daily.    Marland Kitchen lovastatin (MEVACOR) 40 MG tablet Take 1 tablet by mouth daily.    . NONFORMULARY OR COMPOUNDED ITEM Estradiol vaginal cream 0.02% 1 gram twice weekly 90 each 3  . progesterone (PROMETRIUM) 100 MG capsule TAKE (1) CAPSULE DAILY AT BEDTIME. 90 capsule 4  . TURMERIC PO Take by mouth.    . valACYclovir (VALTREX) 500 MG tablet Take one tablet by mouth twice a day for 5 days, then as  needed 20 tablet 1  . nystatin-triamcinolone ointment (MYCOLOG) Apply 1 application topically 2 (two) times daily as needed (use when flying). (Patient not taking: Reported on 04/11/2020)     No current facility-administered medications for this visit.   Allergies: Codeine  Patient's last menstrual period was 09/11/2008.  Past medical history,surgical history, problem list, medications, allergies, family history and social history were all reviewed and documented as reviewed in the EPIC chart.   OBJECTIVE:  BP 118/76   LMP 09/11/2008  The patient appears well, alert, oriented, in no distress. PELVIC EXAM: Deferred due to consultative nature of visit   ASSESSMENT:  64 y.o. [name] here for discussion of HRT  PLAN:  Patient is wanting to know what to do in regards to control of her symptoms but at the same time reducing her risk profile.  She is very aware of the risks of estrogen use to include thrombotic diseases such as heart attack, stroke, DVT, PE and the association with combined HRT and the breast cancer issue.  We again reviewed the findings of the women's health initiative study and minimal new information since that time.  We discussed OTC products which I would recommend she consider transitioning to when she is off of HRT completely to include Estroven/black cohosh and also soy isoflavones supplements/dietary sources.  She is  very concerned and had a scare with the recent breast density finding and we know that estrogen can cause breast tissue changes but cannot guarantee if she does decrease or discontinue the HRT that the breast changes on imaging would improve.  I do recommend that especially after being on HRT for 10 years she should start weaning from hormone use with a goal of discontinuing altogether, as per the current general recommendation guidelines with HRT use.  She is on board with this thinking so with her taking 1 mg estradiol daily right now I recommended a slow taper  down to 1 mg every other day and 0.5 mg every other day while continuing the Prometrium 100 mg daily.  I wrote her a new prescription for 1 mg estradiol tablets and she is well versed in splitting tablets at this point.  In 3 to 6 months we can then consider going to just 0.5 mg daily and see how she does with that.   Joseph Pierini MD 04/11/20

## 2020-06-13 ENCOUNTER — Ambulatory Visit (INDEPENDENT_AMBULATORY_CARE_PROVIDER_SITE_OTHER): Payer: PPO | Admitting: Obstetrics and Gynecology

## 2020-06-13 ENCOUNTER — Other Ambulatory Visit: Payer: Self-pay

## 2020-06-13 ENCOUNTER — Encounter: Payer: Self-pay | Admitting: Obstetrics and Gynecology

## 2020-06-13 VITALS — BP 130/80

## 2020-06-13 DIAGNOSIS — Z79890 Hormone replacement therapy (postmenopausal): Secondary | ICD-10-CM | POA: Diagnosis not present

## 2020-06-13 NOTE — Progress Notes (Signed)
   Kristina Moody [facility] [birth year] [id number]  SUBJECTIVE:  65 y.o. [id number] female presents for further guidance with weaning off of HRT.  The last visit 04/2020 we decreased her estradiol doses to 1 mg on M, W, F and then 0.5 mg the other days of the week, along with Prometrium 100 mg nightly.  Currently no vaginal bleeding and just mild hot flash symptoms.  She has been very interested in weaning off completely due to finding of breast densities on her routine mammogram and concerned that that was associated with HRT use.  Current Outpatient Medications  Medication Sig Dispense Refill  . albuterol (PROVENTIL HFA;VENTOLIN HFA) 108 (90 Base) MCG/ACT inhaler Inhale 2 puffs into the lungs every 4 to 6 hours as needed for wheezing or shortness of breath.    Marland Kitchen CALCIUM PO Take 600 mg by mouth daily. Reported on 06/03/2015    . Cholecalciferol (VITAMIN D) 2000 units CAPS Take 2,000 Units by mouth daily.    . citalopram (CELEXA) 20 MG tablet Take 20 mg by mouth daily.    . clonazePAM (KLONOPIN) 0.5 MG tablet Take 0.5 mg by mouth at bedtime.    . conjugated estrogens (PREMARIN) vaginal cream Place 1 Applicatorful vaginally daily. 42.5 g 4  . estradiol (ESTRACE) 1 MG tablet Take 1 tablet (1 mg total) by mouth every Monday, Wednesday, and Friday. The other days of the week split tablet in half (0.5 mg) 90 tablet 4  . lisinopril (PRINIVIL,ZESTRIL) 10 MG tablet Take 10 mg by mouth daily.    Marland Kitchen lovastatin (MEVACOR) 40 MG tablet Take 1 tablet by mouth daily.    . NONFORMULARY OR COMPOUNDED ITEM Estradiol vaginal cream 0.02% 1 gram twice weekly 90 each 3  . nystatin-triamcinolone ointment (MYCOLOG) Apply 1 application topically 2 (two) times daily as needed (use when flying).    . progesterone (PROMETRIUM) 100 MG capsule TAKE (1) CAPSULE DAILY AT BEDTIME. 90 capsule 4  . TURMERIC PO Take by mouth.    . valACYclovir (VALTREX) 500 MG tablet Take one tablet by mouth twice a day for 5 days, then as needed 20 tablet 1    No current facility-administered medications for this visit.   Allergies: Codeine  Patient's last menstrual period was 09/11/2008.  Past medical history,surgical history, problem list, medications, allergies, family history and social history were all reviewed and documented as reviewed in the EPIC chart.  ROS: Positives and negatives as reviewed in HPI  OBJECTIVE:  BP 130/80 (BP Location: Right Arm, Patient Position: Sitting, Cuff Size: Normal)   LMP 09/11/2008  The patient appears well, alert, oriented, in no distress. PELVIC EXAM: Deferred   ASSESSMENT:  65 y.o. [id number] here for continued guidance on weaning from HRT  PLAN:  The patient prefers a very gradual wean off of HRT to allow time for her body to adjust.  At this point I would recommend splitting her 1 mg tablets in half and just taking 0.5 mg estradiol daily, continuing on the Prometrium 100 mg nightly.  Will let us know if there are any unacceptable vasomotor symptoms or recurrence of vaginal bleeding.  She will follow up at her annual exam and plan to make an appointment for 08/2020.   Theresia Majors MD 06/13/20

## 2020-06-14 DIAGNOSIS — G4700 Insomnia, unspecified: Secondary | ICD-10-CM | POA: Diagnosis not present

## 2020-06-14 DIAGNOSIS — G478 Other sleep disorders: Secondary | ICD-10-CM | POA: Diagnosis not present

## 2020-06-14 DIAGNOSIS — R0681 Apnea, not elsewhere classified: Secondary | ICD-10-CM | POA: Diagnosis not present

## 2020-06-14 DIAGNOSIS — R0683 Snoring: Secondary | ICD-10-CM | POA: Diagnosis not present

## 2020-06-14 DIAGNOSIS — N951 Menopausal and female climacteric states: Secondary | ICD-10-CM | POA: Diagnosis not present

## 2020-06-30 DIAGNOSIS — Z1152 Encounter for screening for COVID-19: Secondary | ICD-10-CM | POA: Diagnosis not present

## 2020-07-03 ENCOUNTER — Telehealth: Payer: Self-pay | Admitting: *Deleted

## 2020-07-03 MED ORDER — VALACYCLOVIR HCL 500 MG PO TABS
ORAL_TABLET | ORAL | 0 refills | Status: DC
Start: 2020-07-03 — End: 2020-11-09

## 2020-07-03 NOTE — Telephone Encounter (Signed)
Patient informed, Rx sent.  

## 2020-07-03 NOTE — Telephone Encounter (Signed)
Yes please send. Thank you.  °

## 2020-07-03 NOTE — Telephone Encounter (Signed)
Dr.Kendall patient called asking if Valtrex 500 mg tablet can be sent to the pharmacy, currently having a outbreak, rare outbreaks,  has COVID as well. Okay to send Rx?

## 2020-07-05 ENCOUNTER — Other Ambulatory Visit: Payer: PPO

## 2020-07-05 DIAGNOSIS — Z20822 Contact with and (suspected) exposure to covid-19: Secondary | ICD-10-CM | POA: Diagnosis not present

## 2020-07-06 LAB — NOVEL CORONAVIRUS, NAA: SARS-CoV-2, NAA: DETECTED — AB

## 2020-07-06 LAB — SARS-COV-2, NAA 2 DAY TAT

## 2020-07-07 ENCOUNTER — Telehealth: Payer: Self-pay

## 2020-07-07 NOTE — Telephone Encounter (Signed)
Called to discuss with patient about COVID-19 symptoms and the use of one of the available treatments for those with mild to moderate Covid symptoms and at a high risk of hospitalization.  Pt appears to qualify for outpatient treatment due to co-morbid conditions and/or a member of an at-risk group in accordance with the FDA Emergency Use Authorization.    Symptom onset: 06/25/20 Vaccinated: Yes Booster? Yes Immunocompromised? No Qualifiers: HTN  Pt. Is out of 7 day window for treatment. States she is feeling better.  Kristina Moody

## 2020-08-07 DIAGNOSIS — G4733 Obstructive sleep apnea (adult) (pediatric): Secondary | ICD-10-CM | POA: Diagnosis not present

## 2020-09-06 ENCOUNTER — Ambulatory Visit: Payer: PPO | Admitting: Obstetrics & Gynecology

## 2020-09-12 DIAGNOSIS — M1811 Unilateral primary osteoarthritis of first carpometacarpal joint, right hand: Secondary | ICD-10-CM | POA: Diagnosis not present

## 2020-09-14 ENCOUNTER — Other Ambulatory Visit: Payer: Self-pay

## 2020-09-14 NOTE — Telephone Encounter (Signed)
Medication refill request: prometrium 100mg  Last AEX:  09-06-2019 Next AEX: 11-24-2020 Last MMG (if hormonal medication request): 10/21 bilateral & left breast mmg category d density birads 1:neg Refill authorized: please approve if appropriate

## 2020-09-15 ENCOUNTER — Ambulatory Visit: Payer: PPO | Admitting: Obstetrics & Gynecology

## 2020-09-15 MED ORDER — PROGESTERONE MICRONIZED 100 MG PO CAPS
100.0000 mg | ORAL_CAPSULE | Freq: Every day | ORAL | 0 refills | Status: DC
Start: 2020-09-15 — End: 2020-11-24

## 2020-09-25 DIAGNOSIS — M67441 Ganglion, right hand: Secondary | ICD-10-CM | POA: Diagnosis not present

## 2020-11-03 IMAGING — MG MM DIGITAL SCREENING BILAT W/ TOMO W/ CAD
8 series · 8 of 24 positions shown · non-contrast
Comparison: Previous exam(s).

CLINICAL DATA: Screening.

EXAM:
DIGITAL SCREENING BILATERAL MAMMOGRAM WITH TOMO AND CAD

[L CC synth-2D]
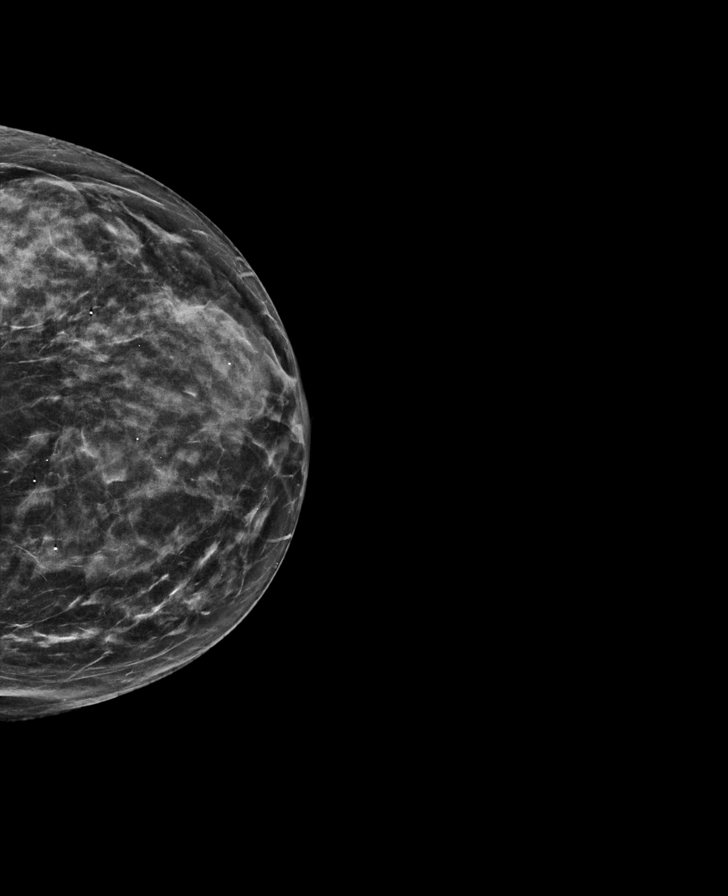

[R MLO synth-2D]
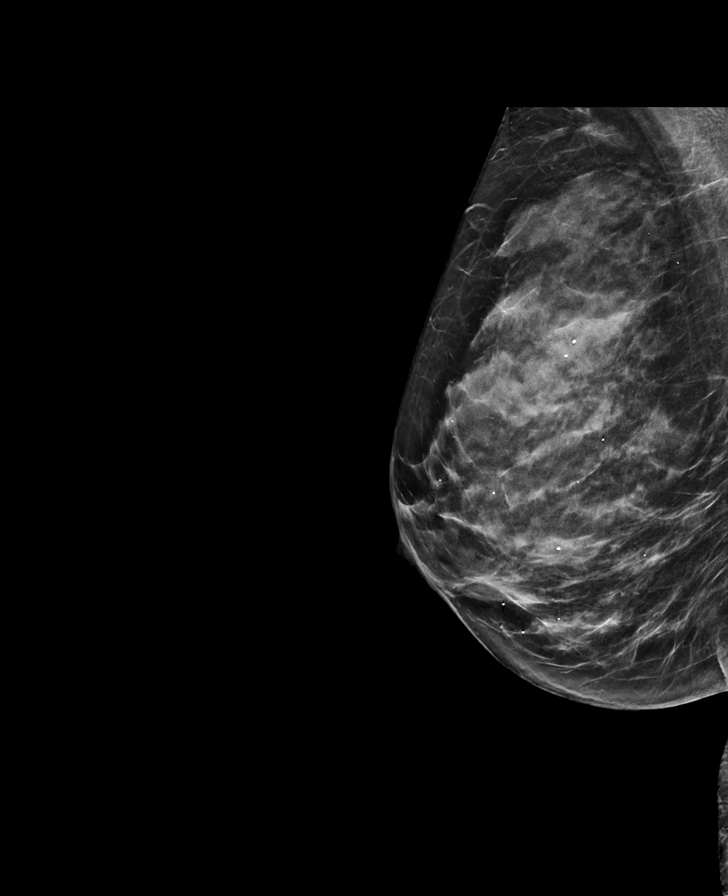

[R CC synth-2D]
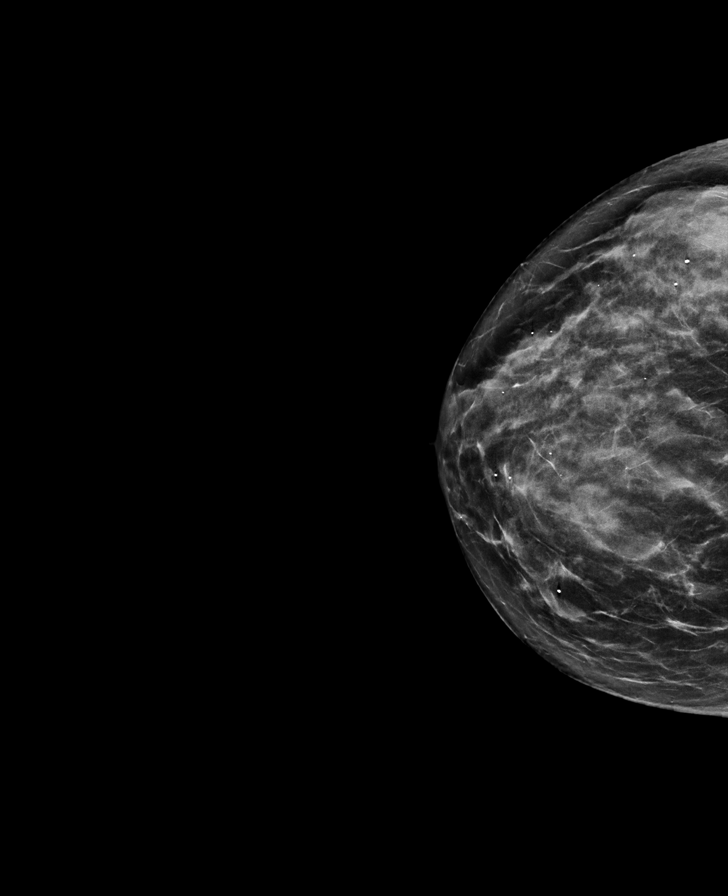

[L MLO synth-2D]
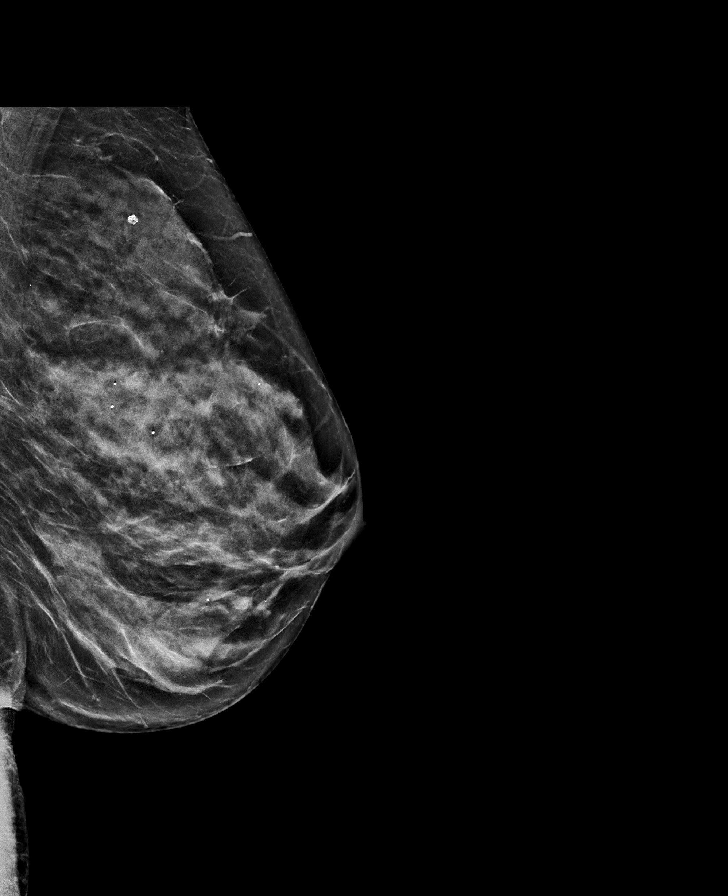

[R MLO tomo · tomo slice 33/66.0]
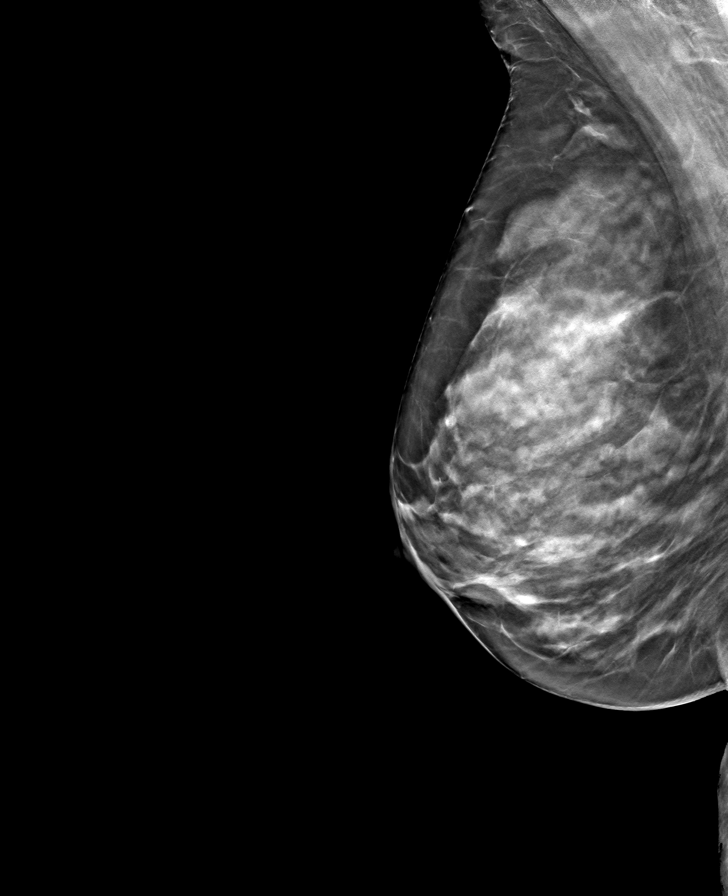

[L MLO tomo · tomo slice 34/67.0]
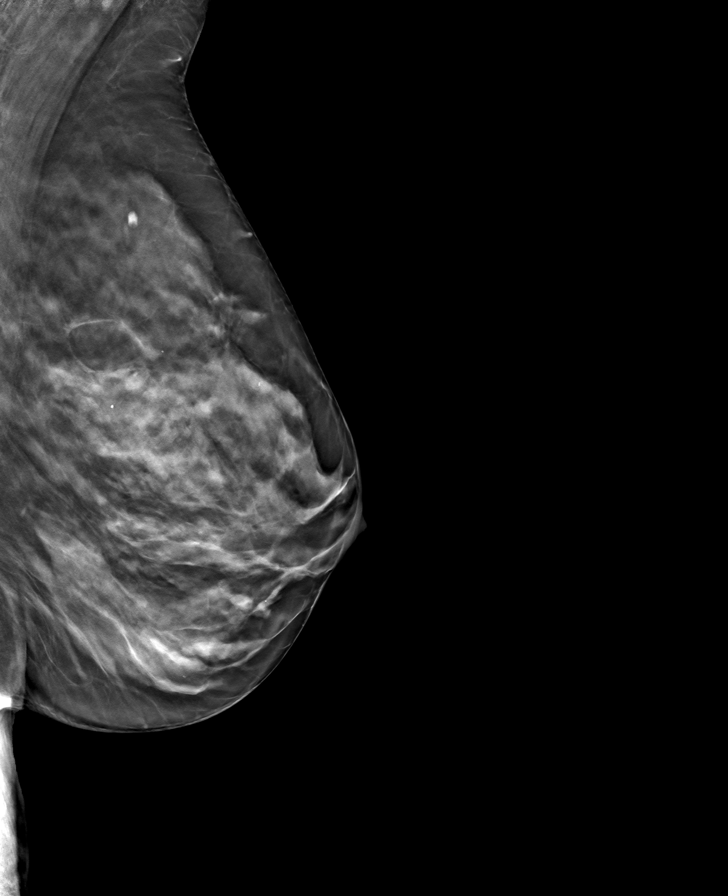

[L CC tomo · tomo slice 29/58.0]
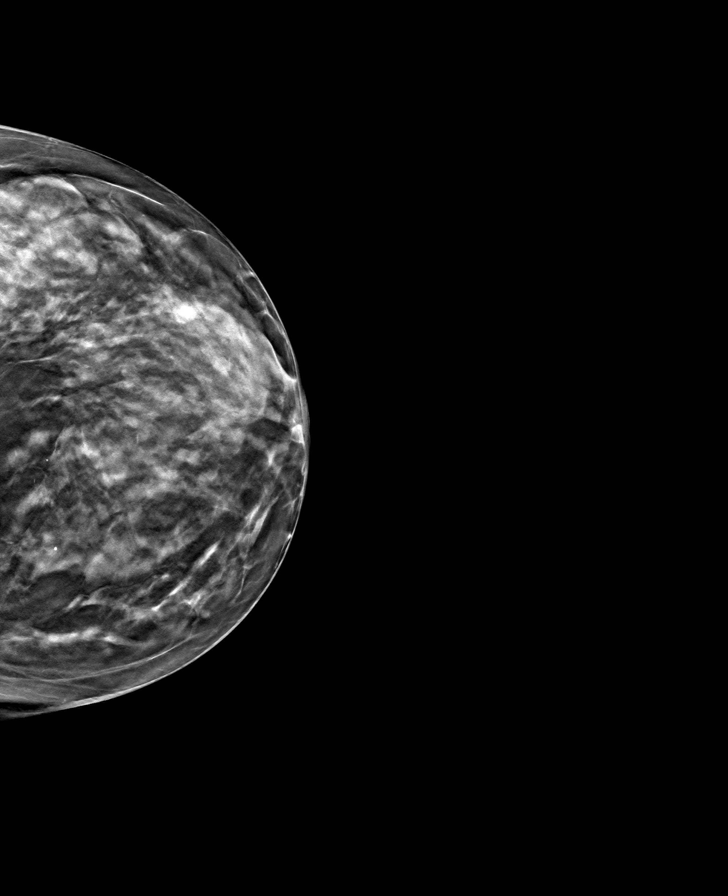

[R CC tomo · tomo slice 31/62.0]
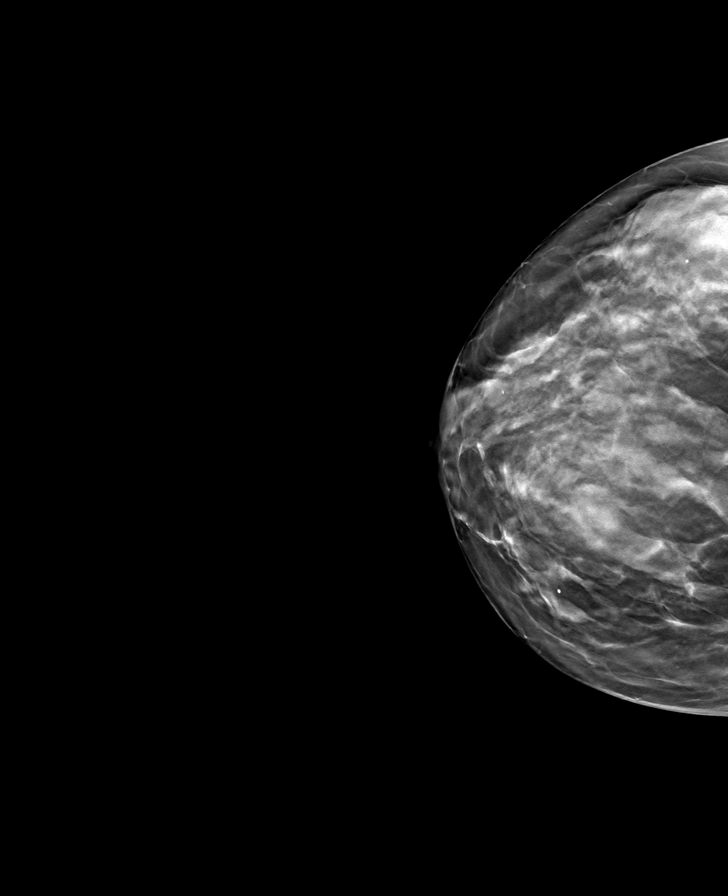

[8 of 24 positions shown; findings below may reference images not displayed]

ACR Breast Density Category c: The breast tissue is heterogeneously
dense, which may obscure small masses.
FINDINGS: There are no findings suspicious for malignancy. Images were
processed with CAD.
IMPRESSION: No mammographic evidence of malignancy. A result letter of this
screening mammogram will be mailed directly to the patient.

RECOMMENDATION:
Screening mammogram in one year. (Code:[id number])

BI-RADS CATEGORY  1: Negative.

## 2020-11-09 ENCOUNTER — Other Ambulatory Visit: Payer: Self-pay | Admitting: *Deleted

## 2020-11-09 DIAGNOSIS — D2272 Melanocytic nevi of left lower limb, including hip: Secondary | ICD-10-CM | POA: Diagnosis not present

## 2020-11-09 DIAGNOSIS — D485 Neoplasm of uncertain behavior of skin: Secondary | ICD-10-CM | POA: Diagnosis not present

## 2020-11-09 DIAGNOSIS — L820 Inflamed seborrheic keratosis: Secondary | ICD-10-CM | POA: Diagnosis not present

## 2020-11-09 DIAGNOSIS — D225 Melanocytic nevi of trunk: Secondary | ICD-10-CM | POA: Diagnosis not present

## 2020-11-09 DIAGNOSIS — L821 Other seborrheic keratosis: Secondary | ICD-10-CM | POA: Diagnosis not present

## 2020-11-09 DIAGNOSIS — D1801 Hemangioma of skin and subcutaneous tissue: Secondary | ICD-10-CM | POA: Diagnosis not present

## 2020-11-09 DIAGNOSIS — L308 Other specified dermatitis: Secondary | ICD-10-CM | POA: Diagnosis not present

## 2020-11-09 DIAGNOSIS — L814 Other melanin hyperpigmentation: Secondary | ICD-10-CM | POA: Diagnosis not present

## 2020-11-09 NOTE — Telephone Encounter (Signed)
Patient left voicemail requesting refill of zovirax sent to [facility].  Returned call to patient. Patient advised currently taking valtrex. Patient states she no longer had her prescription bottle, so was unsure what she was taking. States she is having a stress outbreak and requesting refill.   Medication refill request: Valtrex Last AEX:  09-06-19 [place] Next AEX: 11-24-20 ML Last MMG (if hormonal medication request): n/a Refill authorized: Today, please advise.   Medication pended for #30, 0RF. Please refill if appropriate.

## 2020-11-10 ENCOUNTER — Other Ambulatory Visit: Payer: Self-pay | Admitting: Nurse Practitioner

## 2020-11-10 MED ORDER — VALACYCLOVIR HCL 500 MG PO TABS: ORAL_TABLET | ORAL | 0 refills | Status: DC

## 2020-11-10 NOTE — Telephone Encounter (Signed)
Rx sent. Annual exam scheduled on 11/24/20

## 2020-11-24 ENCOUNTER — Ambulatory Visit (INDEPENDENT_AMBULATORY_CARE_PROVIDER_SITE_OTHER): Payer: PPO | Admitting: Obstetrics & Gynecology

## 2020-11-24 ENCOUNTER — Other Ambulatory Visit (HOSPITAL_COMMUNITY)
Admission: RE | Admit: 2020-11-24 | Discharge: 2020-11-24 | Disposition: A | Discharge status: Home / Self Care | Payer: PPO | Source: Ambulatory Visit | Attending: Obstetrics & Gynecology | Admitting: Obstetrics & Gynecology

## 2020-11-24 ENCOUNTER — Other Ambulatory Visit: Payer: Self-pay

## 2020-11-24 ENCOUNTER — Encounter: Payer: Self-pay | Admitting: Obstetrics & Gynecology

## 2020-11-24 VITALS — BP 124/76 | Ht 65.25 in | Wt 137.0 lb

## 2020-11-24 DIAGNOSIS — M85851 Other specified disorders of bone density and structure, right thigh: Secondary | ICD-10-CM

## 2020-11-24 DIAGNOSIS — Z01419 Encounter for gynecological examination (general) (routine) without abnormal findings: Secondary | ICD-10-CM

## 2020-11-24 DIAGNOSIS — Z780 Asymptomatic menopausal state: Secondary | ICD-10-CM

## 2020-11-24 DIAGNOSIS — N952 Postmenopausal atrophic vaginitis: Secondary | ICD-10-CM | POA: Diagnosis not present

## 2020-11-24 DIAGNOSIS — M85852 Other specified disorders of bone density and structure, left thigh: Secondary | ICD-10-CM | POA: Diagnosis not present

## 2020-11-24 DIAGNOSIS — Z9289 Personal history of other medical treatment: Secondary | ICD-10-CM | POA: Diagnosis not present

## 2020-11-24 DIAGNOSIS — Z9189 Other specified personal risk factors, not elsewhere classified: Secondary | ICD-10-CM | POA: Diagnosis not present

## 2020-11-24 MED ORDER — ESTRADIOL 10 MCG VA TABS: 1.0000 | ORAL_TABLET | VAGINAL | 4 refills | Status: DC

## 2020-11-24 NOTE — Addendum Note (Signed)
Addended by: Princess Bruins on: 11/24/2020 11:22 AM   Modules accepted: Orders

## 2020-11-24 NOTE — Progress Notes (Addendum)
Kristina Moody [facility] 25-Jan-1956 [id number]   History:    65 y.o. G4P2A2L2  RP:  Established patient presenting for annual gyn exam   HPI: Postmenopause, on no HRT x 3 months.  Still experiencing hot flushes and night sweats.  No PMB.  No pelvic pain.  Vaginal dryness with IC.  Breasts normal.  Urine/BMs normal.  BMI good at 22.62.  Doing Yoga and stretching.  Health labs with [place].  BD Osteopenia 09/2019.  Past medical history,surgical history, family history and social history were all reviewed and documented in the EPIC chart.  Gynecologic History Patient's last menstrual period was 09/11/2008.  Obstetric History OB History  Gravida Para Term Preterm AB Living  4 2 2   2 2   SAB IAB Ectopic Multiple Live Births  2   0        # Outcome Date GA Lbr Len/2nd Weight Sex Delivery Anes PTL Lv  4 SAB           3 SAB           2 Term           1 Term              ROS: A ROS was performed and pertinent positives and negatives are included in the history.  GENERAL: No fevers or chills. HEENT: No change in vision, no earache, sore throat or sinus congestion. NECK: No pain or stiffness. CARDIOVASCULAR: No chest pain or pressure. No palpitations. PULMONARY: No shortness of breath, cough or wheeze. GASTROINTESTINAL: No abdominal pain, nausea, vomiting or diarrhea, melena or bright red blood per rectum. GENITOURINARY: No urinary frequency, urgency, hesitancy or dysuria. MUSCULOSKELETAL: No joint or muscle pain, no back pain, no recent trauma. DERMATOLOGIC: No rash, no itching, no lesions. ENDOCRINE: No polyuria, polydipsia, no heat or cold intolerance. No recent change in weight. HEMATOLOGICAL: No anemia or easy bruising or bleeding. NEUROLOGIC: No headache, seizures, numbness, tingling or weakness. PSYCHIATRIC: No depression, no loss of interest in normal activity or change in sleep pattern.     Exam:   BP 124/76   Ht 5' 5.25" (1.657 m)   Wt 137 lb (62.1 kg)   LMP 09/11/2008    BMI 22.62 kg/m   Body mass index is 22.62 kg/m.  General appearance : Well developed well nourished female. No acute distress HEENT: Eyes: no retinal hemorrhage or exudates,  Neck supple, trachea midline, no carotid bruits, no thyroidmegaly Lungs: Clear to auscultation, no rhonchi or wheezes, or rib retractions  Heart: Regular rate and rhythm, no murmurs or gallops Breast:Examined in sitting and supine position were symmetrical in appearance, no palpable masses or tenderness,  no skin retraction, no nipple inversion, no nipple discharge, no skin discoloration, no axillary or supraclavicular lymphadenopathy Abdomen: no palpable masses or tenderness, no rebound or guarding Extremities: no edema or skin discoloration or tenderness  Pelvic: Vulva: Normal             Vagina: No gross lesions or discharge  Cervix: No gross lesions or discharge.  Pap reflex done.  Uterus  AV, normal size, shape and consistency, non-tender and mobile  Adnexa  Without masses or tenderness  Anus: Normal   Assessment/Plan:  65 y.o. female for annual exam   1. Encounter for routine gynecological examination with Papanicolaou smear of cervix Normal gynecologic exam in menopause.  Pap reflex done.  Breast exam normal.  Screening mammogram October 2021 was negative.  Colonoscopy in  2020.  Good body mass index at 22.62.  Continue with yoga and stretching.  Health labs with family physician. - Pap reflex  2. Postmenopause Stopped hormone replacement therapy 3 months ago.  Still having menopausal symptoms in the form of hot flushes and night sweats.  Will try natural estrogens such as black cohosh or Estroven.  3. Postmenopausal atrophic vaginitis Vaginal dryness.  Patient is sexually active.  Will start on Vagifem 1 tablet vaginally twice a week.  May use every night for the first 2 weeks.  No contraindication.  Prescription sent to pharmacy.  4. Osteopenia of necks of both femurs Osteopenia at both femoral necks  on bone density 09/2019.  We will schedule a bone density 09/2021.  Continue with vitamin D supplements, calcium intake of 1.5 g/day total and regular weightbearing physical activities. - DG Bone Density; Future  Other orders - Estradiol 10 MCG TABS vaginal tablet; Place 1 tablet (10 mcg total) vaginally 2 (two) times a week. Will use daily x 2 weeks, then twice a week long term.   Princess Bruins MD, 10:11 AM 11/24/2020

## 2020-11-26 DIAGNOSIS — J019 Acute sinusitis, unspecified: Secondary | ICD-10-CM | POA: Diagnosis not present

## 2020-11-26 DIAGNOSIS — R059 Cough, unspecified: Secondary | ICD-10-CM | POA: Diagnosis not present

## 2020-11-26 DIAGNOSIS — Z03818 Encounter for observation for suspected exposure to other biological agents ruled out: Secondary | ICD-10-CM | POA: Diagnosis not present

## 2020-11-26 DIAGNOSIS — H8111 Benign paroxysmal vertigo, right ear: Secondary | ICD-10-CM | POA: Diagnosis not present

## 2020-11-26 DIAGNOSIS — R0981 Nasal congestion: Secondary | ICD-10-CM | POA: Diagnosis not present

## 2020-11-26 DIAGNOSIS — R519 Headache, unspecified: Secondary | ICD-10-CM | POA: Diagnosis not present

## 2020-11-26 DIAGNOSIS — H6981 Other specified disorders of Eustachian tube, right ear: Secondary | ICD-10-CM | POA: Diagnosis not present

## 2020-11-27 LAB — CYTOLOGY - PAP: Diagnosis: NEGATIVE

## 2020-12-07 DIAGNOSIS — G4700 Insomnia, unspecified: Secondary | ICD-10-CM | POA: Diagnosis not present

## 2020-12-07 DIAGNOSIS — I10 Essential (primary) hypertension: Secondary | ICD-10-CM | POA: Diagnosis not present

## 2020-12-07 DIAGNOSIS — E7800 Pure hypercholesterolemia, unspecified: Secondary | ICD-10-CM | POA: Diagnosis not present

## 2020-12-07 DIAGNOSIS — J309 Allergic rhinitis, unspecified: Secondary | ICD-10-CM | POA: Diagnosis not present

## 2020-12-07 DIAGNOSIS — F325 Major depressive disorder, single episode, in full remission: Secondary | ICD-10-CM | POA: Diagnosis not present

## 2020-12-07 DIAGNOSIS — E559 Vitamin D deficiency, unspecified: Secondary | ICD-10-CM | POA: Diagnosis not present

## 2021-01-02 DIAGNOSIS — G4733 Obstructive sleep apnea (adult) (pediatric): Secondary | ICD-10-CM | POA: Diagnosis not present

## 2021-01-18 DIAGNOSIS — G4733 Obstructive sleep apnea (adult) (pediatric): Secondary | ICD-10-CM | POA: Diagnosis not present

## 2021-01-29 ENCOUNTER — Ambulatory Visit: Payer: PPO | Admitting: Podiatry

## 2021-01-29 ENCOUNTER — Other Ambulatory Visit: Payer: Self-pay

## 2021-01-29 ENCOUNTER — Encounter: Payer: Self-pay | Admitting: Podiatry

## 2021-01-29 DIAGNOSIS — B070 Plantar wart: Secondary | ICD-10-CM | POA: Diagnosis not present

## 2021-01-29 MED ORDER — FLUOROURACIL 5 % EX CREA
TOPICAL_CREAM | Freq: Two times a day (BID) | CUTANEOUS | 2 refills | Status: DC
Start: 2021-01-29 — End: 2023-01-31

## 2021-01-30 DIAGNOSIS — M25561 Pain in right knee: Secondary | ICD-10-CM | POA: Diagnosis not present

## 2021-01-30 DIAGNOSIS — M25562 Pain in left knee: Secondary | ICD-10-CM | POA: Diagnosis not present

## 2021-01-30 DIAGNOSIS — M7061 Trochanteric bursitis, right hip: Secondary | ICD-10-CM | POA: Diagnosis not present

## 2021-01-30 DIAGNOSIS — M4120 Other idiopathic scoliosis, site unspecified: Secondary | ICD-10-CM | POA: Diagnosis not present

## 2021-01-30 DIAGNOSIS — M7062 Trochanteric bursitis, left hip: Secondary | ICD-10-CM | POA: Diagnosis not present

## 2021-01-30 NOTE — Progress Notes (Signed)
Subjective:   Patient ID: Kristina Moody, female   DOB: 65 y.o.   MRN: [id number]   HPI Patient states that she is developed 2 lesions on the bottom of her right foot x2 months duration stating that it appears every 10 years for the duration of her life and she has it treated.  States they get sore and patient does not smoke likes to be active   Review of Systems  All other systems reviewed and are negative.      Objective:  Physical Exam Vitals and nursing note reviewed.  Constitutional:      Appearance: She is well-developed.  Pulmonary:     Effort: Pulmonary effort is normal.  Musculoskeletal:        General: Normal range of motion.  Skin:    General: Skin is warm.  Neurological:     Mental Status: She is alert.    Neurovascular status found to be intact muscle strength was found to be adequate range of motion adequate.  Patient is found to have plantar lesion subsecond metatarsal right and right heel that upon debridement show pinpoint bleeding pain to lateral pressure and have small black dots associated with them.  Good digital perfusion well oriented x3     Assessment:  Verruca plantaris plantar aspect right foot     Plan:  H&P reviewed condition Sharp sterile debridement of lesions accomplished and applied chemical agent to create immune response with sterile dressings and placed on Efudex home usage.  Discussed further treatment in future depending on response to conservative care

## 2021-02-02 DIAGNOSIS — G4733 Obstructive sleep apnea (adult) (pediatric): Secondary | ICD-10-CM | POA: Diagnosis not present

## 2021-02-02 DIAGNOSIS — H524 Presbyopia: Secondary | ICD-10-CM | POA: Diagnosis not present

## 2021-02-02 DIAGNOSIS — H2513 Age-related nuclear cataract, bilateral: Secondary | ICD-10-CM | POA: Diagnosis not present

## 2021-02-18 DIAGNOSIS — G4733 Obstructive sleep apnea (adult) (pediatric): Secondary | ICD-10-CM | POA: Diagnosis not present

## 2021-02-20 DIAGNOSIS — M7062 Trochanteric bursitis, left hip: Secondary | ICD-10-CM | POA: Diagnosis not present

## 2021-02-20 DIAGNOSIS — M7631 Iliotibial band syndrome, right leg: Secondary | ICD-10-CM | POA: Diagnosis not present

## 2021-02-20 DIAGNOSIS — M7632 Iliotibial band syndrome, left leg: Secondary | ICD-10-CM | POA: Diagnosis not present

## 2021-02-20 DIAGNOSIS — M7061 Trochanteric bursitis, right hip: Secondary | ICD-10-CM | POA: Diagnosis not present

## 2021-02-23 ENCOUNTER — Other Ambulatory Visit: Payer: Self-pay | Admitting: Family Medicine

## 2021-02-23 DIAGNOSIS — Z1231 Encounter for screening mammogram for malignant neoplasm of breast: Secondary | ICD-10-CM

## 2021-02-27 DIAGNOSIS — M7632 Iliotibial band syndrome, left leg: Secondary | ICD-10-CM | POA: Diagnosis not present

## 2021-02-27 DIAGNOSIS — M7061 Trochanteric bursitis, right hip: Secondary | ICD-10-CM | POA: Diagnosis not present

## 2021-02-27 DIAGNOSIS — M7062 Trochanteric bursitis, left hip: Secondary | ICD-10-CM | POA: Diagnosis not present

## 2021-02-27 DIAGNOSIS — M7631 Iliotibial band syndrome, right leg: Secondary | ICD-10-CM | POA: Diagnosis not present

## 2021-03-23 DIAGNOSIS — M7062 Trochanteric bursitis, left hip: Secondary | ICD-10-CM | POA: Diagnosis not present

## 2021-03-23 DIAGNOSIS — M7631 Iliotibial band syndrome, right leg: Secondary | ICD-10-CM | POA: Diagnosis not present

## 2021-03-23 DIAGNOSIS — M7061 Trochanteric bursitis, right hip: Secondary | ICD-10-CM | POA: Diagnosis not present

## 2021-03-23 DIAGNOSIS — M7632 Iliotibial band syndrome, left leg: Secondary | ICD-10-CM | POA: Diagnosis not present

## 2021-03-29 ENCOUNTER — Ambulatory Visit
Admission: RE | Admit: 2021-03-29 | Discharge: 2021-03-29 | Disposition: A | Discharge status: Home / Self Care | Payer: PPO | Source: Ambulatory Visit | Attending: Family Medicine | Admitting: Family Medicine

## 2021-03-29 ENCOUNTER — Other Ambulatory Visit: Payer: Self-pay

## 2021-03-29 DIAGNOSIS — Z1231 Encounter for screening mammogram for malignant neoplasm of breast: Secondary | ICD-10-CM | POA: Diagnosis not present

## 2021-04-11 DIAGNOSIS — F325 Major depressive disorder, single episode, in full remission: Secondary | ICD-10-CM | POA: Diagnosis not present

## 2021-04-11 DIAGNOSIS — M7632 Iliotibial band syndrome, left leg: Secondary | ICD-10-CM | POA: Diagnosis not present

## 2021-04-11 DIAGNOSIS — G4700 Insomnia, unspecified: Secondary | ICD-10-CM | POA: Diagnosis not present

## 2021-04-11 DIAGNOSIS — M7062 Trochanteric bursitis, left hip: Secondary | ICD-10-CM | POA: Diagnosis not present

## 2021-04-11 DIAGNOSIS — R61 Generalized hyperhidrosis: Secondary | ICD-10-CM | POA: Diagnosis not present

## 2021-04-11 DIAGNOSIS — N959 Unspecified menopausal and perimenopausal disorder: Secondary | ICD-10-CM | POA: Diagnosis not present

## 2021-04-11 DIAGNOSIS — M7061 Trochanteric bursitis, right hip: Secondary | ICD-10-CM | POA: Diagnosis not present

## 2021-04-11 DIAGNOSIS — M7631 Iliotibial band syndrome, right leg: Secondary | ICD-10-CM | POA: Diagnosis not present

## 2021-04-11 DIAGNOSIS — G4733 Obstructive sleep apnea (adult) (pediatric): Secondary | ICD-10-CM | POA: Diagnosis not present

## 2021-04-12 DIAGNOSIS — L570 Actinic keratosis: Secondary | ICD-10-CM | POA: Diagnosis not present

## 2021-04-20 DIAGNOSIS — M7631 Iliotibial band syndrome, right leg: Secondary | ICD-10-CM | POA: Diagnosis not present

## 2021-04-20 DIAGNOSIS — M7062 Trochanteric bursitis, left hip: Secondary | ICD-10-CM | POA: Diagnosis not present

## 2021-04-20 DIAGNOSIS — M7061 Trochanteric bursitis, right hip: Secondary | ICD-10-CM | POA: Diagnosis not present

## 2021-04-20 DIAGNOSIS — M7632 Iliotibial band syndrome, left leg: Secondary | ICD-10-CM | POA: Diagnosis not present

## 2021-04-26 DIAGNOSIS — H8110 Benign paroxysmal vertigo, unspecified ear: Secondary | ICD-10-CM | POA: Diagnosis not present

## 2021-04-27 DIAGNOSIS — M7632 Iliotibial band syndrome, left leg: Secondary | ICD-10-CM | POA: Diagnosis not present

## 2021-04-27 DIAGNOSIS — M7062 Trochanteric bursitis, left hip: Secondary | ICD-10-CM | POA: Diagnosis not present

## 2021-04-27 DIAGNOSIS — M7061 Trochanteric bursitis, right hip: Secondary | ICD-10-CM | POA: Diagnosis not present

## 2021-04-27 DIAGNOSIS — M7631 Iliotibial band syndrome, right leg: Secondary | ICD-10-CM | POA: Diagnosis not present

## 2021-04-30 DIAGNOSIS — G4733 Obstructive sleep apnea (adult) (pediatric): Secondary | ICD-10-CM | POA: Diagnosis not present

## 2021-05-09 DIAGNOSIS — M7632 Iliotibial band syndrome, left leg: Secondary | ICD-10-CM | POA: Diagnosis not present

## 2021-05-09 DIAGNOSIS — M7062 Trochanteric bursitis, left hip: Secondary | ICD-10-CM | POA: Diagnosis not present

## 2021-05-09 DIAGNOSIS — M7631 Iliotibial band syndrome, right leg: Secondary | ICD-10-CM | POA: Diagnosis not present

## 2021-05-09 DIAGNOSIS — M7061 Trochanteric bursitis, right hip: Secondary | ICD-10-CM | POA: Diagnosis not present

## 2021-05-11 DIAGNOSIS — M7062 Trochanteric bursitis, left hip: Secondary | ICD-10-CM | POA: Diagnosis not present

## 2021-05-11 DIAGNOSIS — M7632 Iliotibial band syndrome, left leg: Secondary | ICD-10-CM | POA: Diagnosis not present

## 2021-05-11 DIAGNOSIS — M7061 Trochanteric bursitis, right hip: Secondary | ICD-10-CM | POA: Diagnosis not present

## 2021-05-11 DIAGNOSIS — M7631 Iliotibial band syndrome, right leg: Secondary | ICD-10-CM | POA: Diagnosis not present

## 2021-05-16 DIAGNOSIS — M7632 Iliotibial band syndrome, left leg: Secondary | ICD-10-CM | POA: Diagnosis not present

## 2021-05-16 DIAGNOSIS — M7062 Trochanteric bursitis, left hip: Secondary | ICD-10-CM | POA: Diagnosis not present

## 2021-05-16 DIAGNOSIS — M7061 Trochanteric bursitis, right hip: Secondary | ICD-10-CM | POA: Diagnosis not present

## 2021-05-16 DIAGNOSIS — M7631 Iliotibial band syndrome, right leg: Secondary | ICD-10-CM | POA: Diagnosis not present

## 2021-05-17 DIAGNOSIS — H8110 Benign paroxysmal vertigo, unspecified ear: Secondary | ICD-10-CM | POA: Diagnosis not present

## 2021-05-22 DIAGNOSIS — L820 Inflamed seborrheic keratosis: Secondary | ICD-10-CM | POA: Diagnosis not present

## 2021-05-22 DIAGNOSIS — L570 Actinic keratosis: Secondary | ICD-10-CM | POA: Diagnosis not present

## 2021-05-25 DIAGNOSIS — M7061 Trochanteric bursitis, right hip: Secondary | ICD-10-CM | POA: Diagnosis not present

## 2021-05-25 DIAGNOSIS — M7632 Iliotibial band syndrome, left leg: Secondary | ICD-10-CM | POA: Diagnosis not present

## 2021-05-25 DIAGNOSIS — M7062 Trochanteric bursitis, left hip: Secondary | ICD-10-CM | POA: Diagnosis not present

## 2021-05-25 DIAGNOSIS — M7631 Iliotibial band syndrome, right leg: Secondary | ICD-10-CM | POA: Diagnosis not present

## 2021-05-30 DIAGNOSIS — M7062 Trochanteric bursitis, left hip: Secondary | ICD-10-CM | POA: Diagnosis not present

## 2021-05-30 DIAGNOSIS — M7631 Iliotibial band syndrome, right leg: Secondary | ICD-10-CM | POA: Diagnosis not present

## 2021-05-30 DIAGNOSIS — M7632 Iliotibial band syndrome, left leg: Secondary | ICD-10-CM | POA: Diagnosis not present

## 2021-05-30 DIAGNOSIS — M7061 Trochanteric bursitis, right hip: Secondary | ICD-10-CM | POA: Diagnosis not present

## 2021-06-15 DIAGNOSIS — G4733 Obstructive sleep apnea (adult) (pediatric): Secondary | ICD-10-CM | POA: Diagnosis not present

## 2021-06-18 DIAGNOSIS — H8110 Benign paroxysmal vertigo, unspecified ear: Secondary | ICD-10-CM | POA: Diagnosis not present

## 2021-06-22 DIAGNOSIS — M7062 Trochanteric bursitis, left hip: Secondary | ICD-10-CM | POA: Diagnosis not present

## 2021-06-22 DIAGNOSIS — M7061 Trochanteric bursitis, right hip: Secondary | ICD-10-CM | POA: Diagnosis not present

## 2021-06-22 DIAGNOSIS — M7632 Iliotibial band syndrome, left leg: Secondary | ICD-10-CM | POA: Diagnosis not present

## 2021-06-22 DIAGNOSIS — M7631 Iliotibial band syndrome, right leg: Secondary | ICD-10-CM | POA: Diagnosis not present

## 2021-07-11 DIAGNOSIS — M47816 Spondylosis without myelopathy or radiculopathy, lumbar region: Secondary | ICD-10-CM | POA: Diagnosis not present

## 2021-07-11 DIAGNOSIS — M7061 Trochanteric bursitis, right hip: Secondary | ICD-10-CM | POA: Diagnosis not present

## 2021-07-11 DIAGNOSIS — M9903 Segmental and somatic dysfunction of lumbar region: Secondary | ICD-10-CM | POA: Diagnosis not present

## 2021-07-11 DIAGNOSIS — M7632 Iliotibial band syndrome, left leg: Secondary | ICD-10-CM | POA: Diagnosis not present

## 2021-07-11 DIAGNOSIS — M7631 Iliotibial band syndrome, right leg: Secondary | ICD-10-CM | POA: Diagnosis not present

## 2021-07-11 DIAGNOSIS — M47892 Other spondylosis, cervical region: Secondary | ICD-10-CM | POA: Diagnosis not present

## 2021-07-11 DIAGNOSIS — M9901 Segmental and somatic dysfunction of cervical region: Secondary | ICD-10-CM | POA: Diagnosis not present

## 2021-07-11 DIAGNOSIS — M7062 Trochanteric bursitis, left hip: Secondary | ICD-10-CM | POA: Diagnosis not present

## 2021-07-12 DIAGNOSIS — M47816 Spondylosis without myelopathy or radiculopathy, lumbar region: Secondary | ICD-10-CM | POA: Diagnosis not present

## 2021-07-12 DIAGNOSIS — M47892 Other spondylosis, cervical region: Secondary | ICD-10-CM | POA: Diagnosis not present

## 2021-07-12 DIAGNOSIS — M9901 Segmental and somatic dysfunction of cervical region: Secondary | ICD-10-CM | POA: Diagnosis not present

## 2021-07-12 DIAGNOSIS — M9903 Segmental and somatic dysfunction of lumbar region: Secondary | ICD-10-CM | POA: Diagnosis not present

## 2021-07-19 DIAGNOSIS — M9903 Segmental and somatic dysfunction of lumbar region: Secondary | ICD-10-CM | POA: Diagnosis not present

## 2021-07-19 DIAGNOSIS — M9901 Segmental and somatic dysfunction of cervical region: Secondary | ICD-10-CM | POA: Diagnosis not present

## 2021-07-19 DIAGNOSIS — M47816 Spondylosis without myelopathy or radiculopathy, lumbar region: Secondary | ICD-10-CM | POA: Diagnosis not present

## 2021-07-19 DIAGNOSIS — M47892 Other spondylosis, cervical region: Secondary | ICD-10-CM | POA: Diagnosis not present

## 2021-07-24 DIAGNOSIS — M47816 Spondylosis without myelopathy or radiculopathy, lumbar region: Secondary | ICD-10-CM | POA: Diagnosis not present

## 2021-07-24 DIAGNOSIS — M9901 Segmental and somatic dysfunction of cervical region: Secondary | ICD-10-CM | POA: Diagnosis not present

## 2021-07-24 DIAGNOSIS — M9903 Segmental and somatic dysfunction of lumbar region: Secondary | ICD-10-CM | POA: Diagnosis not present

## 2021-07-24 DIAGNOSIS — M47892 Other spondylosis, cervical region: Secondary | ICD-10-CM | POA: Diagnosis not present

## 2021-07-31 DIAGNOSIS — M47816 Spondylosis without myelopathy or radiculopathy, lumbar region: Secondary | ICD-10-CM | POA: Diagnosis not present

## 2021-07-31 DIAGNOSIS — M9901 Segmental and somatic dysfunction of cervical region: Secondary | ICD-10-CM | POA: Diagnosis not present

## 2021-07-31 DIAGNOSIS — M9903 Segmental and somatic dysfunction of lumbar region: Secondary | ICD-10-CM | POA: Diagnosis not present

## 2021-07-31 DIAGNOSIS — M47892 Other spondylosis, cervical region: Secondary | ICD-10-CM | POA: Diagnosis not present

## 2021-08-07 DIAGNOSIS — M47816 Spondylosis without myelopathy or radiculopathy, lumbar region: Secondary | ICD-10-CM | POA: Diagnosis not present

## 2021-08-07 DIAGNOSIS — M9903 Segmental and somatic dysfunction of lumbar region: Secondary | ICD-10-CM | POA: Diagnosis not present

## 2021-08-07 DIAGNOSIS — M9901 Segmental and somatic dysfunction of cervical region: Secondary | ICD-10-CM | POA: Diagnosis not present

## 2021-08-07 DIAGNOSIS — M47892 Other spondylosis, cervical region: Secondary | ICD-10-CM | POA: Diagnosis not present

## 2021-08-10 DIAGNOSIS — M9903 Segmental and somatic dysfunction of lumbar region: Secondary | ICD-10-CM | POA: Diagnosis not present

## 2021-08-10 DIAGNOSIS — M47816 Spondylosis without myelopathy or radiculopathy, lumbar region: Secondary | ICD-10-CM | POA: Diagnosis not present

## 2021-08-10 DIAGNOSIS — M47892 Other spondylosis, cervical region: Secondary | ICD-10-CM | POA: Diagnosis not present

## 2021-08-10 DIAGNOSIS — M9901 Segmental and somatic dysfunction of cervical region: Secondary | ICD-10-CM | POA: Diagnosis not present

## 2021-08-15 DIAGNOSIS — M47816 Spondylosis without myelopathy or radiculopathy, lumbar region: Secondary | ICD-10-CM | POA: Diagnosis not present

## 2021-08-15 DIAGNOSIS — M9903 Segmental and somatic dysfunction of lumbar region: Secondary | ICD-10-CM | POA: Diagnosis not present

## 2021-08-15 DIAGNOSIS — M9901 Segmental and somatic dysfunction of cervical region: Secondary | ICD-10-CM | POA: Diagnosis not present

## 2021-08-15 DIAGNOSIS — M47892 Other spondylosis, cervical region: Secondary | ICD-10-CM | POA: Diagnosis not present

## 2021-08-16 DIAGNOSIS — Z822 Family history of deafness and hearing loss: Secondary | ICD-10-CM | POA: Diagnosis not present

## 2021-08-16 DIAGNOSIS — H903 Sensorineural hearing loss, bilateral: Secondary | ICD-10-CM | POA: Diagnosis not present

## 2021-08-20 DIAGNOSIS — E7800 Pure hypercholesterolemia, unspecified: Secondary | ICD-10-CM | POA: Diagnosis not present

## 2021-08-20 DIAGNOSIS — E559 Vitamin D deficiency, unspecified: Secondary | ICD-10-CM | POA: Diagnosis not present

## 2021-08-20 DIAGNOSIS — G4700 Insomnia, unspecified: Secondary | ICD-10-CM | POA: Diagnosis not present

## 2021-08-20 DIAGNOSIS — Z23 Encounter for immunization: Secondary | ICD-10-CM | POA: Diagnosis not present

## 2021-08-20 DIAGNOSIS — I10 Essential (primary) hypertension: Secondary | ICD-10-CM | POA: Diagnosis not present

## 2021-08-20 DIAGNOSIS — F325 Major depressive disorder, single episode, in full remission: Secondary | ICD-10-CM | POA: Diagnosis not present

## 2021-08-22 DIAGNOSIS — M47816 Spondylosis without myelopathy or radiculopathy, lumbar region: Secondary | ICD-10-CM | POA: Diagnosis not present

## 2021-08-22 DIAGNOSIS — M9903 Segmental and somatic dysfunction of lumbar region: Secondary | ICD-10-CM | POA: Diagnosis not present

## 2021-08-22 DIAGNOSIS — M9901 Segmental and somatic dysfunction of cervical region: Secondary | ICD-10-CM | POA: Diagnosis not present

## 2021-08-22 DIAGNOSIS — M47892 Other spondylosis, cervical region: Secondary | ICD-10-CM | POA: Diagnosis not present

## 2021-11-15 DIAGNOSIS — D2272 Melanocytic nevi of left lower limb, including hip: Secondary | ICD-10-CM | POA: Diagnosis not present

## 2021-11-15 DIAGNOSIS — L814 Other melanin hyperpigmentation: Secondary | ICD-10-CM | POA: Diagnosis not present

## 2021-11-15 DIAGNOSIS — L821 Other seborrheic keratosis: Secondary | ICD-10-CM | POA: Diagnosis not present

## 2021-11-15 DIAGNOSIS — D225 Melanocytic nevi of trunk: Secondary | ICD-10-CM | POA: Diagnosis not present

## 2021-11-15 DIAGNOSIS — D1801 Hemangioma of skin and subcutaneous tissue: Secondary | ICD-10-CM | POA: Diagnosis not present

## 2021-11-16 ENCOUNTER — Other Ambulatory Visit: Payer: Self-pay | Admitting: Obstetrics & Gynecology

## 2021-11-16 ENCOUNTER — Other Ambulatory Visit: Payer: Self-pay

## 2021-11-16 NOTE — Telephone Encounter (Signed)
Pt is scheduled for her AEX on 12/14/21.   Spoke with pt just to confirm the Rx request. Pt reports in the past Dr. Phineas Real would Rx her the premarin cream to use externally-mainly for when she is travelling or working and has to sit for a long period of time, she tends to get irritated around the external vulva. States she doesn't typically have to use it that often, will take ~9 months to get through one tube.   She reports no desire to use vaseline, A&D, aquaphor etc. Due to the thickness of those.   Please advise.

## 2021-11-16 NOTE — Telephone Encounter (Signed)
Will refuse Rx due to change in therapy as of last AEX w/ ML on 11/24/20 to vagifem tablets

## 2021-11-19 NOTE — Telephone Encounter (Signed)
Pt is scheduled for her AEX on 12/14/21.  Pt requests Rx refill, states in the past Dr. Phineas Real would Rx her the premarin cream to use externally-mainly for when she is travelling or working and has to sit for a long period of time, she tends to get irritated around the external vulva. States she doesn't typically have to use it that often, will take ~9 months to get through one tube.    She reports no desire to use vaseline, A&D, aquaphor etc. Due to the thickness of those.

## 2021-11-20 NOTE — Telephone Encounter (Addendum)
Per ML: "Please make sure she is not using both Vagifem tab and Premarin cream.  It's one or the other."  LDVM per DPR.

## 2021-11-28 ENCOUNTER — Ambulatory Visit: Payer: PPO | Admitting: Obstetrics & Gynecology

## 2021-12-14 ENCOUNTER — Ambulatory Visit: Payer: PPO | Admitting: Obstetrics & Gynecology

## 2022-01-03 DIAGNOSIS — H8110 Benign paroxysmal vertigo, unspecified ear: Secondary | ICD-10-CM | POA: Diagnosis not present

## 2022-01-10 ENCOUNTER — Ambulatory Visit: Payer: PPO | Admitting: Obstetrics & Gynecology

## 2022-01-22 DIAGNOSIS — H8110 Benign paroxysmal vertigo, unspecified ear: Secondary | ICD-10-CM | POA: Diagnosis not present

## 2022-01-23 ENCOUNTER — Ambulatory Visit (INDEPENDENT_AMBULATORY_CARE_PROVIDER_SITE_OTHER): Payer: PPO | Admitting: Obstetrics & Gynecology

## 2022-01-23 ENCOUNTER — Encounter: Payer: Self-pay | Admitting: Obstetrics & Gynecology

## 2022-01-23 VITALS — BP 120/80 | Ht 65.0 in | Wt 136.0 lb

## 2022-01-23 DIAGNOSIS — N952 Postmenopausal atrophic vaginitis: Secondary | ICD-10-CM | POA: Diagnosis not present

## 2022-01-23 DIAGNOSIS — M85851 Other specified disorders of bone density and structure, right thigh: Secondary | ICD-10-CM

## 2022-01-23 DIAGNOSIS — M85852 Other specified disorders of bone density and structure, left thigh: Secondary | ICD-10-CM | POA: Diagnosis not present

## 2022-01-23 DIAGNOSIS — Z780 Asymptomatic menopausal state: Secondary | ICD-10-CM

## 2022-01-23 DIAGNOSIS — Z9189 Other specified personal risk factors, not elsewhere classified: Secondary | ICD-10-CM

## 2022-01-23 DIAGNOSIS — B009 Herpesviral infection, unspecified: Secondary | ICD-10-CM | POA: Diagnosis not present

## 2022-01-23 DIAGNOSIS — Z01419 Encounter for gynecological examination (general) (routine) without abnormal findings: Secondary | ICD-10-CM

## 2022-01-23 MED ORDER — ESTRADIOL 10 MCG VA TABS: 1.0000 | ORAL_TABLET | VAGINAL | 4 refills | Status: DC

## 2022-01-23 NOTE — Progress Notes (Signed)
Kristina Moody [facility] 02-Jan-1956 [id number]   History:    66 y.o. G4P2A2L2   RP:  Established patient presenting for annual gyn exam    HPI: Postmenopause, on no systemic HRT x > 1 year.  Still experiencing hot flushes and night sweats, but tolerable.  No PMB.  No pelvic pain.  Vaginal dryness with IC, recommend coconut oil.  On Vagifem twice a week.  Pap 11/2020 Neg.  No h/o abnormal Pap.  No indication to repeat Pap at this time. Breasts normal.  Mammo Neg 03/2021.  Urine/BMs normal.  BMI good at 22.63.  Doing PT, Yoga and stretching.  Health labs with [place].  BD Osteopenia T-Score -1.6 in 09/2019.  Will repeat BD here now.    Past medical history,surgical history, family history and social history were all reviewed and documented in the EPIC chart.  Gynecologic History Patient's last menstrual period was 09/11/2008.  Obstetric History OB History  Gravida Para Term Preterm AB Living  '4 2 2   2 2  '$ SAB IAB Ectopic Multiple Live Births  2   0        # Outcome Date GA Lbr Len/2nd Weight Sex Delivery Anes PTL Lv  4 SAB           3 SAB           2 Term           1 Term              ROS: A ROS was performed and pertinent positives and negatives are included in the history. GENERAL: No fevers or chills. HEENT: No change in vision, no earache, sore throat or sinus congestion. NECK: No pain or stiffness. CARDIOVASCULAR: No chest pain or pressure. No palpitations. PULMONARY: No shortness of breath, cough or wheeze. GASTROINTESTINAL: No abdominal pain, nausea, vomiting or diarrhea, melena or bright red blood per rectum. GENITOURINARY: No urinary frequency, urgency, hesitancy or dysuria. MUSCULOSKELETAL: No joint or muscle pain, no back pain, no recent trauma. DERMATOLOGIC: No rash, no itching, no lesions. ENDOCRINE: No polyuria, polydipsia, no heat or cold intolerance. No recent change in weight. HEMATOLOGICAL: No anemia or easy bruising or bleeding. NEUROLOGIC: No headache,  seizures, numbness, tingling or weakness. PSYCHIATRIC: No depression, no loss of interest in normal activity or change in sleep pattern.     Exam:   BP 120/80 (BP Location: Right Arm, Patient Position: Sitting, Cuff Size: Normal)   Ht '5\' 5"'$  (1.651 m)   Wt 136 lb (61.7 kg)   LMP 09/11/2008   BMI 22.63 kg/m   Body mass index is 22.63 kg/m.  General appearance : Well developed well nourished female. No acute distress HEENT: Eyes: no retinal hemorrhage or exudates,  Neck supple, trachea midline, no carotid bruits, no thyroidmegaly Lungs: Clear to auscultation, no rhonchi or wheezes, or rib retractions  Heart: Regular rate and rhythm, no murmurs or gallops Breast:Examined in sitting and supine position were symmetrical in appearance, no palpable masses or tenderness,  no skin retraction, no nipple inversion, no nipple discharge, no skin discoloration, no axillary or supraclavicular lymphadenopathy Abdomen: no palpable masses or tenderness, no rebound or guarding Extremities: no edema or skin discoloration or tenderness  Pelvic: Vulva: Normal             Vagina: No gross lesions or discharge  Cervix: No gross lesions or discharge  Uterus  AV, normal size, shape and consistency, non-tender and mobile  Adnexa  Without masses or tenderness  Anus: Normal   Assessment/Plan:  66 y.o. female for annual exam   1. Well female exam with routine gynecological exam Postmenopause, on no systemic HRT x > 1 year.  Still experiencing hot flushes and night sweats, but tolerable.  No PMB.  No pelvic pain.  Vaginal dryness with IC, recommend coconut oil.  On Vagifem twice a week.  Pap 11/2020 Neg.  No h/o abnormal Pap.  No indication to repeat Pap at this time. Breasts normal.  Mammo Neg 03/2021.  Urine/BMs normal.  BMI good at 22.63.  Doing PT, Yoga and stretching.  Health labs with [place].  BD Osteopenia T-Score -1.6 in 09/2019.  Will repeat BD here now.   2. Postmenopause Postmenopause,  on no systemic HRT x > 1 year.  Still experiencing hot flushes and night sweats, but tolerable.  No PMB.  No pelvic pain.  Vaginal dryness with IC, recommend coconut oil.   3. Postmenopausal atrophic vaginitis Vaginal dryness with IC, recommend coconut oil.  On Vagifem twice a week. Will continue on Vagifem.  Prescription sent to pharmacy.  4. Osteopenia of necks of both femurs BD Osteopenia T-Score -1.6 in 09/2019.  Will repeat BD here now. - DG Bone Density; Future  Other orders - amLODipine (NORVASC) 2.5 MG tablet; Take 1 tablet by mouth daily. - Estradiol 10 MCG TABS vaginal tablet; Place 1 tablet (10 mcg total) vaginally 2 (two) times a week.   Princess Bruins MD, 9:45 AM 01/23/2022

## 2022-02-19 ENCOUNTER — Other Ambulatory Visit: Payer: Self-pay | Admitting: Obstetrics & Gynecology

## 2022-02-19 ENCOUNTER — Ambulatory Visit (INDEPENDENT_AMBULATORY_CARE_PROVIDER_SITE_OTHER): Payer: PPO

## 2022-02-19 DIAGNOSIS — Z780 Asymptomatic menopausal state: Secondary | ICD-10-CM | POA: Diagnosis not present

## 2022-02-19 DIAGNOSIS — M85851 Other specified disorders of bone density and structure, right thigh: Secondary | ICD-10-CM

## 2022-02-19 DIAGNOSIS — Z13820 Encounter for screening for osteoporosis: Secondary | ICD-10-CM

## 2022-02-25 ENCOUNTER — Other Ambulatory Visit: Payer: Self-pay | Admitting: Obstetrics & Gynecology

## 2022-02-25 DIAGNOSIS — M8589 Other specified disorders of bone density and structure, multiple sites: Secondary | ICD-10-CM

## 2022-02-26 ENCOUNTER — Telehealth: Payer: Self-pay

## 2022-02-26 ENCOUNTER — Other Ambulatory Visit: Payer: PPO

## 2022-02-26 DIAGNOSIS — M8589 Other specified disorders of bone density and structure, multiple sites: Secondary | ICD-10-CM

## 2022-02-26 NOTE — Telephone Encounter (Signed)
Pt scheduled for 02/26/22 @ 3pm

## 2022-02-26 NOTE — Telephone Encounter (Signed)
-----   Message from Princess Bruins, MD sent at 02/25/2022 12:24 PM EDT ----- Regarding: Vit D check to schedule Order for Vit D entered.  Please inform patient and schedule.

## 2022-02-27 ENCOUNTER — Other Ambulatory Visit: Payer: Self-pay

## 2022-02-27 DIAGNOSIS — E559 Vitamin D deficiency, unspecified: Secondary | ICD-10-CM

## 2022-02-27 LAB — VITAMIN D 25 HYDROXY (VIT D DEFICIENCY, FRACTURES): Vit D, 25-Hydroxy: 23 ng/mL — ABNORMAL LOW (ref 30–100)

## 2022-02-27 MED ORDER — VITAMIN D (ERGOCALCIFEROL) 1.25 MG (50000 UNIT) PO CAPS: 50000.0000 [IU] | ORAL_CAPSULE | ORAL | 0 refills | Status: DC

## 2022-02-28 ENCOUNTER — Other Ambulatory Visit: Payer: Self-pay | Admitting: Family Medicine

## 2022-02-28 DIAGNOSIS — Z1231 Encounter for screening mammogram for malignant neoplasm of breast: Secondary | ICD-10-CM

## 2022-03-14 DIAGNOSIS — H2513 Age-related nuclear cataract, bilateral: Secondary | ICD-10-CM | POA: Diagnosis not present

## 2022-03-14 DIAGNOSIS — H524 Presbyopia: Secondary | ICD-10-CM | POA: Diagnosis not present

## 2022-04-04 ENCOUNTER — Ambulatory Visit: Payer: PPO

## 2022-04-09 DIAGNOSIS — R7303 Prediabetes: Secondary | ICD-10-CM | POA: Diagnosis not present

## 2022-04-09 DIAGNOSIS — Z0000 Encounter for general adult medical examination without abnormal findings: Secondary | ICD-10-CM | POA: Diagnosis not present

## 2022-04-09 DIAGNOSIS — E559 Vitamin D deficiency, unspecified: Secondary | ICD-10-CM | POA: Diagnosis not present

## 2022-04-09 DIAGNOSIS — Z1159 Encounter for screening for other viral diseases: Secondary | ICD-10-CM | POA: Diagnosis not present

## 2022-04-09 DIAGNOSIS — I10 Essential (primary) hypertension: Secondary | ICD-10-CM | POA: Diagnosis not present

## 2022-04-09 DIAGNOSIS — Z1331 Encounter for screening for depression: Secondary | ICD-10-CM | POA: Diagnosis not present

## 2022-04-09 DIAGNOSIS — E7800 Pure hypercholesterolemia, unspecified: Secondary | ICD-10-CM | POA: Diagnosis not present

## 2022-04-09 DIAGNOSIS — G4700 Insomnia, unspecified: Secondary | ICD-10-CM | POA: Diagnosis not present

## 2022-04-11 ENCOUNTER — Ambulatory Visit
Admission: RE | Admit: 2022-04-11 | Discharge: 2022-04-11 | Disposition: A | Discharge status: Home / Self Care | Payer: PPO | Source: Ambulatory Visit | Attending: Family Medicine | Admitting: Family Medicine

## 2022-04-11 DIAGNOSIS — Z1231 Encounter for screening mammogram for malignant neoplasm of breast: Secondary | ICD-10-CM | POA: Diagnosis not present

## 2022-05-08 DIAGNOSIS — R053 Chronic cough: Secondary | ICD-10-CM | POA: Diagnosis not present

## 2022-05-08 DIAGNOSIS — J40 Bronchitis, not specified as acute or chronic: Secondary | ICD-10-CM | POA: Diagnosis not present

## 2022-05-09 DIAGNOSIS — M546 Pain in thoracic spine: Secondary | ICD-10-CM | POA: Diagnosis not present

## 2022-05-14 ENCOUNTER — Ambulatory Visit
Admission: RE | Admit: 2022-05-14 | Discharge: 2022-05-14 | Disposition: A | Discharge status: Home / Self Care | Payer: PPO | Source: Ambulatory Visit | Attending: Urgent Care | Admitting: Urgent Care

## 2022-05-14 ENCOUNTER — Ambulatory Visit (INDEPENDENT_AMBULATORY_CARE_PROVIDER_SITE_OTHER): Payer: PPO

## 2022-05-14 VITALS — BP 124/74 | HR 94 | Temp 99.0°F | Resp 18

## 2022-05-14 DIAGNOSIS — J209 Acute bronchitis, unspecified: Secondary | ICD-10-CM

## 2022-05-14 DIAGNOSIS — R059 Cough, unspecified: Secondary | ICD-10-CM | POA: Diagnosis not present

## 2022-05-14 MED ORDER — PREDNISONE 20 MG PO TABS: ORAL_TABLET | ORAL | 0 refills | Status: DC

## 2022-05-14 MED ORDER — PROMETHAZINE-DM 6.25-15 MG/5ML PO SYRP: 5.0000 mL | ORAL_SOLUTION | Freq: Three times a day (TID) | ORAL | 0 refills | Status: DC | PRN

## 2022-05-14 NOTE — ED Provider Notes (Signed)
[facility]  Note:  This document was prepared using Systems analyst and may include unintentional dictation errors.  MRN: [id number] DOB: 08-10-1955  Subjective:   Kristina Moody is a 66 y.o. female presenting for 4-week history of acute onset persistent coughing, chest discomfort.  Patient has already undergone a course of azithromycin and has been using [employer] with minimal relief.  Patient reports that the cough has been keeping her up at night.  No smoking, vaping, marijuana use.  No history of respiratory disorders.  Patient's primary concern is to rule out pneumonia.  No current facility-administered medications for this encounter.  Current Outpatient Medications:    albuterol (PROVENTIL HFA;VENTOLIN HFA) 108 (90 Base) MCG/ACT inhaler, Inhale 2 puffs into the lungs every 4 to 6 hours as needed for wheezing or shortness of breath., Disp: , Rfl:    amLODipine (NORVASC) 2.5 MG tablet, Take 1 tablet by mouth daily., Disp: , Rfl:    CALCIUM PO, Take 600 mg by mouth daily. Reported on 06/03/2015, Disp: , Rfl:    Cholecalciferol (VITAMIN D) 2000 units CAPS, Take 2,000 Units by mouth daily., Disp: , Rfl:    citalopram (CELEXA) 20 MG tablet, Take 20 mg by mouth daily., Disp: , Rfl:    clonazePAM (KLONOPIN) 0.5 MG tablet, Take 0.5 mg by mouth at bedtime., Disp: , Rfl:    Estradiol 10 MCG TABS vaginal tablet, Place 1 tablet (10 mcg total) vaginally 2 (two) times a week., Disp: 24 tablet, Rfl: 4   fluorouracil (EFUDEX) 5 % cream, Apply topically 2 (two) times daily., Disp: 40 g, Rfl: 2   lisinopril (PRINIVIL,ZESTRIL) 10 MG tablet, Take 10 mg by mouth daily., Disp: , Rfl:    lovastatin (MEVACOR) 40 MG tablet, Take 1 tablet by mouth daily., Disp: , Rfl:    nystatin-triamcinolone ointment (MYCOLOG), Apply 1 application  topically 2 (two) times daily as needed (use when flying)., Disp: , Rfl:    TURMERIC PO, Take by mouth., Disp: , Rfl:     valACYclovir (VALTREX) 500 MG tablet, Take one tablet by mouth twice daily for 3-5 days for outbreak then as needed., Disp: 30 tablet, Rfl: 0   Vitamin D, Ergocalciferol, (DRISDOL) 1.25 MG (50000 UNIT) CAPS capsule, Take 1 capsule (50,000 Units total) by mouth every 7 (seven) days., Disp: 12 capsule, Rfl: 0   Allergies  Allergen Reactions   Codeine Itching    Past Medical History:  Diagnosis Date   Elevated cholesterol    History of colposcopy with cervical biopsy 2004, 2005   LGSIL   HSV (herpes simplex virus) infection    Hx gestational diabetes    Hypertension    Osteopenia 07/2017   T score -1.6 FRAX 17% / 1.8%.  Stable from prior DEXA     Past Surgical History:  Procedure Laterality Date   CESAREAN SECTION  06/15/1992   boy   [place] (R) ARM    Family History  Problem Relation Age of Onset   Diabetes Mother    Hypertension Mother    Heart disease Mother    Dementia Mother    Hypertension Father    Heart disease Father    Cancer Father        PROSTATE, BLADDER    Social History   Tobacco Use   Smoking status: Former   Smokeless tobacco: Never  Vaping  Use   Vaping Use: Never used  Substance Use Topics   Alcohol use: Yes    Alcohol/week: 7.0 standard drinks of alcohol    Types: 7 Standard drinks or equivalent per week   Drug use: No    ROS   Objective:   Vitals: BP 124/74 (BP Location: Left Arm)   Pulse 94   Temp 99 F (37.2 C) (Oral)   Resp 18   LMP 09/11/2008   SpO2 99%   Physical Exam Constitutional:      General: She is not in acute distress.    Appearance: Normal appearance. She is well-developed. She is not ill-appearing, toxic-appearing or diaphoretic.  HENT:     Head: Normocephalic and atraumatic.     Nose: Nose normal.     Mouth/Throat:     Mouth: Mucous membranes are moist.  Eyes:     General: No scleral icterus.       Right eye: No discharge.        Left eye:  No discharge.     Extraocular Movements: Extraocular movements intact.  Cardiovascular:     Rate and Rhythm: Normal rate and regular rhythm.     Heart sounds: Normal heart sounds. No murmur heard.    No friction rub. No gallop.  Pulmonary:     Effort: Pulmonary effort is normal. No respiratory distress.     Breath sounds: No stridor. No wheezing, rhonchi or rales.  Chest:     Chest wall: No tenderness.  Skin:    General: Skin is warm and dry.  Neurological:     General: No focal deficit present.     Mental Status: She is alert and oriented to person, place, and time.  Psychiatric:        Mood and Affect: Mood normal.        Behavior: Behavior normal.     DG Chest 2 View  Result Date: 05/14/2022 CLINICAL DATA:  Persistent cough for 4 weeks. EXAM: CHEST - 2 VIEW COMPARISON:  Radiographs 10/14/2017 and 09/17/2017. FINDINGS: The heart size and mediastinal contours are stable. There is possible mild central airway thickening without recurrent focal airspace disease, edema, pleural effusion or pneumothorax. Stable mild degenerative changes in the thoracic spine. No acute osseous findings. IMPRESSION: Possible mild central airway thickening. No evidence of recurrent pneumonia or other acute process. Electronically Signed   By: Richardean Sale M.D.   On: 05/14/2022 13:36     Assessment and Plan :   PDMP not reviewed this encounter.  1. Acute bronchitis, unspecified organism     Recommended oral prednisone course for management of her bronchitis.  Use supportive care otherwise.  No antibiotics needed as there is no sign of pneumonia. Counseled patient on potential for adverse effects with medications prescribed/recommended today, ER and return-to-clinic precautions discussed, patient verbalized understanding.    Jaynee Eagles, [place] 05/14/22 [phone]

## 2022-05-14 NOTE — ED Triage Notes (Signed)
Pt reports a persistent cough x 4 days weeks. Reports giving a prescription for  z-pack and tessalon pearls which she had no relief. Pt reports having trouble sleeping at night.

## 2022-05-17 ENCOUNTER — Ambulatory Visit
Admission: EM | Admit: 2022-05-17 | Discharge: 2022-05-17 | Disposition: A | Discharge status: Home / Self Care | Payer: PPO | Attending: Emergency Medicine | Admitting: Emergency Medicine

## 2022-05-17 DIAGNOSIS — R052 Subacute cough: Secondary | ICD-10-CM

## 2022-05-17 MED ORDER — IPRATROPIUM-ALBUTEROL 0.5-2.5 (3) MG/3ML IN SOLN
3.0000 mL | Freq: Once | RESPIRATORY_TRACT | Status: AC
  Administered 2022-05-17: 3 mL via RESPIRATORY_TRACT

## 2022-05-17 MED ORDER — ALBUTEROL SULFATE (2.5 MG/3ML) 0.083% IN NEBU
2.5000 mg | INHALATION_SOLUTION | Freq: Once | RESPIRATORY_TRACT | Status: AC
  Administered 2022-05-17: 2.5 mg via RESPIRATORY_TRACT

## 2022-05-17 MED ORDER — ALUM & MAG HYDROXIDE-SIMETH 200-200-20 MG/5ML PO SUSP
30.0000 mL | Freq: Once | ORAL | Status: AC
  Administered 2022-05-17: 30 mL via ORAL

## 2022-05-17 MED ORDER — HYDROCOD POLI-CHLORPHE POLI ER 10-8 MG/5ML PO SUER: 5.0000 mL | Freq: Two times a day (BID) | ORAL | 0 refills | Status: DC | PRN

## 2022-05-17 MED ORDER — LIDOCAINE VISCOUS HCL 2 % MT SOLN
15.0000 mL | Freq: Once | OROMUCOSAL | Status: AC
  Administered 2022-05-17: 15 mL via OROMUCOSAL

## 2022-05-17 MED ORDER — FAMOTIDINE 20 MG PO TABS: 20.0000 mg | ORAL_TABLET | Freq: Two times a day (BID) | ORAL | 0 refills | Status: DC

## 2022-05-17 NOTE — ED Provider Notes (Signed)
HPI  SUBJECTIVE:  Kristina Moody is a 66 y.o. female who presents with a dry cough for the past month.  She reports shortness of breath with coughing for the past week.  States she feels as if she cannot take a deep breath and because it triggers off her cough.  She reports 2 episodes of chest tightness/heaviness this morning that responded to albuterol.  Denies accompanying nausea, diaphoresis, radiation of this pain up her neck, down her arm, or through to her back.  Reports occasional shortness of breath with exertion.  She is unable to sleep at night secondary to the cough.  She tried 2 puffs from her albuterol inhaler with a spacer, and has been taking hot tea and honey and is on prednisone day 4/5.  The albuterol helped.  Her cough is worse with lying down flat.  No sinus pain or pressure, postnasal drip, GERD or allergy symptoms.  No calf pain, swelling, hemoptysis, exogenous estrogen, surgery in the past month, recent immobilization.  She has never had symptoms like this before.  She has a past medical history of pneumonia, is a former smoker.  No history of pulmonary disease, GERD, PE, DVT, CHF, cancer.  PCP: [facility].  She was seen here 3 days ago for month of cough, chest discomfort.  Chest x-ray was negative for pneumonia.  She was thought to have bronchitis and was prescribed prednisone.  She has already finished a course of azithromycin and Tessalon.  Past Medical History:  Diagnosis Date   Elevated cholesterol    History of colposcopy with cervical biopsy 2004, 2005   LGSIL   HSV (herpes simplex virus) infection    Hx gestational diabetes    Hypertension    Osteopenia 07/2017   T score -1.6 FRAX 17% / 1.8%.  Stable from prior DEXA    Past Surgical History:  Procedure Laterality Date   CESAREAN SECTION  06/15/1992   boy   COLPOSCOPY     COSMETIC SURGERY     SKIN SURGERY     BIRTH MARK REMOVED (R) ARM    Family History  Problem Relation Age of Onset   Diabetes  Mother    Hypertension Mother    Heart disease Mother    Dementia Mother    Hypertension Father    Heart disease Father    Cancer Father        PROSTATE, BLADDER    Social History   Tobacco Use   Smoking status: Former   Smokeless tobacco: Never  Vaping Use   Vaping Use: Never used  Substance Use Topics   Alcohol use: Yes    Alcohol/week: 7.0 standard drinks of alcohol    Types: 7 Standard drinks or equivalent per week   Drug use: No    No current facility-administered medications for this encounter.  Current Outpatient Medications:    albuterol (PROVENTIL HFA;VENTOLIN HFA) 108 (90 Base) MCG/ACT inhaler, Inhale 2 puffs into the lungs every 4 to 6 hours as needed for wheezing or shortness of breath., Disp: , Rfl:    amLODipine (NORVASC) 2.5 MG tablet, Take 1 tablet by mouth daily., Disp: , Rfl:    CALCIUM PO, Take 600 mg by mouth daily. Reported on 06/03/2015, Disp: , Rfl:    chlorpheniramine-HYDROcodone (TUSSIONEX) 10-8 MG/5ML, Take 5 mLs by mouth every 12 (twelve) hours as needed for cough., Disp: 60 mL, Rfl: 0   Cholecalciferol (VITAMIN D) 2000 units CAPS, Take 2,000 Units by mouth daily., Disp: , Rfl:  citalopram (CELEXA) 20 MG tablet, Take 20 mg by mouth daily., Disp: , Rfl:    clonazePAM (KLONOPIN) 0.5 MG tablet, Take 0.5 mg by mouth at bedtime., Disp: , Rfl:    Estradiol 10 MCG TABS vaginal tablet, Place 1 tablet (10 mcg total) vaginally 2 (two) times a week., Disp: 24 tablet, Rfl: 4   famotidine (PEPCID) 20 MG tablet, Take 1 tablet (20 mg total) by mouth 2 (two) times daily., Disp: 40 tablet, Rfl: 0   lisinopril (PRINIVIL,ZESTRIL) 10 MG tablet, Take 10 mg by mouth daily., Disp: , Rfl:    lovastatin (MEVACOR) 40 MG tablet, Take 1 tablet by mouth daily., Disp: , Rfl:    predniSONE (DELTASONE) 20 MG tablet, Take 2 tablets daily with breakfast., Disp: 10 tablet, Rfl: 0   TURMERIC PO, Take by mouth., Disp: , Rfl:    Vitamin D, Ergocalciferol, (DRISDOL) 1.25 MG (50000 UNIT)  CAPS capsule, Take 1 capsule (50,000 Units total) by mouth every 7 (seven) days., Disp: 12 capsule, Rfl: 0   fluorouracil (EFUDEX) 5 % cream, Apply topically 2 (two) times daily., Disp: 40 g, Rfl: 2   nystatin-triamcinolone ointment (MYCOLOG), Apply 1 application  topically 2 (two) times daily as needed (use when flying)., Disp: , Rfl:    valACYclovir (VALTREX) 500 MG tablet, Take one tablet by mouth twice daily for 3-5 days for outbreak then as needed., Disp: 30 tablet, Rfl: 0  Allergies  Allergen Reactions   Codeine Itching     ROS  As noted in HPI.   Physical Exam  BP 133/68 (BP Location: Left Arm)   Pulse 77   Temp 98.5 F (36.9 C) (Oral)   Resp 20   LMP 09/11/2008   SpO2 98%   Constitutional: Well developed, well nourished, no acute distress.  Continuous dry cough Eyes:  EOMI, conjunctiva normal bilaterally HENT: Normocephalic, atraumatic,mucus membranes moist.  No nasal congestion.  No maxillary, frontal sinus tenderness.  No postnasal drip. Respiratory: Normal inspiratory effort, lungs clear bilaterally.  No anterior, lateral chest wall tenderness Cardiovascular: Normal rate, regular rhythm, no murmurs rubs or gallops GI: nondistended skin: No rash, skin intact Musculoskeletal: Calves symmetric, nontender, no edema Neurologic: Alert & oriented x 3, no focal neuro deficits Psychiatric: Speech and behavior appropriate   ED Course   Medications  albuterol (PROVENTIL) (2.5 MG/3ML) 0.083% nebulizer solution 2.5 mg (2.5 mg Nebulization Given 05/17/22 0845)  ipratropium-albuterol (DUONEB) 0.5-2.5 (3) MG/3ML nebulizer solution 3 mL (3 mLs Nebulization Given 05/17/22 0845)  alum & mag hydroxide-simeth (MAALOX/MYLANTA) 200-200-20 MG/5ML suspension 30 mL (30 mLs Oral Given 05/17/22 0846)  lidocaine (XYLOCAINE) 2 % viscous mouth solution 15 mL (15 mLs Mouth/Throat Given 05/17/22 1610)    Orders Placed This Encounter  Procedures   Ambulatory referral to Pulmonology    Referral  Priority:   Routine    Referral Type:   Consultation    Referral Reason:   Specialty Services Required    Requested Specialty:   Pulmonary Disease    Number of Visits Requested:   1    No results found for this or any previous visit (from the past 24 hour(s)). No results found.  ED Clinical Impression  1. Subacute cough      ED Assessment/Plan     Previous records reviewed.  As noted in HPI.  Patient states that her shortness of breath and chest heaviness improved somewhat with 2 puffs from her albuterol inhaler.  Cough could be from postnasal drip, asthma, GERD.  Doubt PE.  Has normal vitals, calves are symmetric, nontender.  She had no evidence of pneumonia on x-ray 3 days ago and has been afebrile.  She has no evidence of CHF.  Will give DuoNeb 5/0.5 mg and a GI cocktail to see if this improves her symptoms.  Will reevaluate  On reevaluation, patient states that she feels much better.  States she feels as if she can take a deep breath then finally without coughing.  Her cough has improved.  Her lungs are still clear, she has better air movement.  Suspect bronchospasm versus GERD.  Will have her continue the prednisone, do regularly scheduled albuterol inhaler with a spacer for 4 days, then as needed thereafter, start some Pepcid in case this is acid reflux, Tussidex, I will put in a referral to pulmonology.  ER return precautions given.  [place] Narcotic database reviewed for this patient, and feel that the risk/benefit ratio today is favorable for proceeding with a prescription for controlled substance.  No opiate prescriptions in the past 2 years.  She does have a prescription for clonazepam.  Will advise her to not take this if taking the Tussionex  Discussed MDM, treatment plan, and plan for follow-up with patient. Discussed sn/sx that should prompt return to the ED. patient agrees with plan.   Meds ordered this encounter  Medications   albuterol (PROVENTIL) (2.5 MG/3ML) 0.083%  nebulizer solution 2.5 mg   ipratropium-albuterol (DUONEB) 0.5-2.5 (3) MG/3ML nebulizer solution 3 mL   alum & mag hydroxide-simeth (MAALOX/MYLANTA) 200-200-20 MG/5ML suspension 30 mL   lidocaine (XYLOCAINE) 2 % viscous mouth solution 15 mL   chlorpheniramine-HYDROcodone (TUSSIONEX) 10-8 MG/5ML    Sig: Take 5 mLs by mouth every 12 (twelve) hours as needed for cough.    Dispense:  60 mL    Refill:  0   famotidine (PEPCID) 20 MG tablet    Sig: Take 1 tablet (20 mg total) by mouth 2 (two) times daily.    Dispense:  40 tablet    Refill:  0      *This clinic note was created using [employer]. Therefore, there may be occasional mistakes despite careful proofreading.  ?    Melynda Ripple, MD 05/17/22 [phone]

## 2022-05-17 NOTE — Discharge Instructions (Signed)
2 puffs from your albuterol inhaler every 4 hours for 2 days, then every 6 hours for 2 days, then as needed.  Make sure you use your spacer.  You can back off on the albuterol if you start to improve sooner.  Finish prednisone.  I am prescribing you Tussionex.  Do not take Klonopin if you are taking Tussionex as it has a narcotic in it.  Try the Pepcid as well.  Have referred you to pulmonology, but give them a call and try and arrange an appointment ASAP.

## 2022-05-17 NOTE — ED Triage Notes (Signed)
Pt states that she has been having a cough for over 1 month and been seen in multiple times. Pt states this morning she began coughing and having shortness of breath. Pt states that she had chest tightness that improved with her inhaler. Pt states she cannot take a deep breath. Pt has completed her zpack.

## 2022-05-21 ENCOUNTER — Ambulatory Visit: Payer: PPO | Admitting: Internal Medicine

## 2022-05-21 ENCOUNTER — Encounter: Payer: Self-pay | Admitting: Internal Medicine

## 2022-05-21 VITALS — BP 130/68 | HR 101 | Ht 66.0 in | Wt 131.6 lb

## 2022-05-21 DIAGNOSIS — J45909 Unspecified asthma, uncomplicated: Secondary | ICD-10-CM | POA: Insufficient documentation

## 2022-05-21 DIAGNOSIS — J4541 Moderate persistent asthma with (acute) exacerbation: Secondary | ICD-10-CM | POA: Diagnosis not present

## 2022-05-21 MED ORDER — HYDROCOD POLI-CHLORPHE POLI ER 10-8 MG/5ML PO SUER: 5.0000 mL | Freq: Two times a day (BID) | ORAL | 0 refills | Status: DC | PRN

## 2022-05-21 MED ORDER — TRELEGY ELLIPTA 100-62.5-25 MCG/ACT IN AEPB: 1.0000 | INHALATION_SPRAY | Freq: Every day | RESPIRATORY_TRACT | 0 refills | Status: DC

## 2022-05-21 MED ORDER — PREDNISONE 10 MG PO TABS: ORAL_TABLET | ORAL | 0 refills | Status: DC

## 2022-05-21 NOTE — Patient Instructions (Signed)
Order- sample x 2 Trelegy 100     inhale 1 puff then rinse mouth, once daily  Script sent for prednisone taper  Script sent for tussionex cough sysrup..  You can also use an otc cough syrup like Delsym, your Ventolin rescue inhaler and sugar free hard candy lozenges like Sugar Free Jolly Ranchers if you want.  Please call as needed

## 2022-05-21 NOTE — Progress Notes (Signed)
05/21/22- 64 yoF former smoker for pulmonary evaluation courtesy of Melynda Ripple, MD Medical problem list includes HTN, Osteopenia, Herpes Simplex, Concern is persistent dry cough with wheeze Covid vax- 3 Phizer - albuterol hfa, Note Lisinopril,  Flu vax-had ED 05/17/22 and 05/14/22-Was seen in the ED for a dry/prod cough x6 weeks, has been on antibiotic, cough syrup and tessalon.  Tested negative for COVID.  She and her husband had colds about 6 weeks ago.  He got over his but she has not.  Never similar prior.  Remotely tested negative for allergies.  Has had a rescue inhaler off-and-on a few times with bronchitis over the years.  With current illness persistent cough has been mostly dry with scant clear sputum.  Notes mild wheezing especially lying down at night.  Not much aware of sinus pressure or drainage, or reflux.  Between her PCP and the ED she has been given a nebulizer treatment, a few days of prednisone, Z-Pak and then some promethazine DM cough syrup and finally Tussionex.  She was tried on Pepcid for a few days with no apparent benefit.  Cough interferes with sleep and she is getting tired. --Consider possibility of lisinopril cough if this persists CXR 05/14/22- MPRESSION: Possible mild central airway thickening. No evidence of recurrent pneumonia or other acute process.  Prior to Admission medications   Medication Sig Start Date End Date Taking? Authorizing Provider  albuterol (PROVENTIL HFA;VENTOLIN HFA) 108 (90 Base) MCG/ACT inhaler Inhale 2 puffs into the lungs every 4 to 6 hours as needed for wheezing or shortness of breath.   Yes [provider]  amLODipine (NORVASC) 2.5 MG tablet Take 1 tablet by mouth daily.   Yes [provider]  CALCIUM PO Take 600 mg by mouth daily. Reported on 06/03/2015   Yes [provider]  chlorpheniramine-HYDROcodone (TUSSIONEX) 10-8 MG/5ML Take 5 mLs by mouth every 12 (twelve) hours as needed for cough. 05/17/22  Yes  Melynda Ripple, MD  chlorpheniramine-HYDROcodone (TUSSIONEX) 10-8 MG/5ML Take 5 mLs by mouth every 12 (twelve) hours as needed for cough. 05/21/22  Yes Regene Mccarthy, Tarri Fuller D, MD  Cholecalciferol (VITAMIN D) 2000 units CAPS Take 2,000 Units by mouth daily.   Yes [provider]  citalopram (CELEXA) 20 MG tablet Take 20 mg by mouth daily.   Yes [provider]  clonazePAM (KLONOPIN) 0.5 MG tablet Take 0.5 mg by mouth at bedtime.   Yes [provider]  Estradiol 10 MCG TABS vaginal tablet Place 1 tablet (10 mcg total) vaginally 2 (two) times a week. 01/24/22  Yes Princess Bruins, MD  fluorouracil (EFUDEX) 5 % cream Apply topically 2 (two) times daily. 01/29/21  Yes Regal, Tamala Fothergill, DPM  Fluticasone-Umeclidin-Vilant (TRELEGY ELLIPTA) 100-62.5-25 MCG/ACT AEPB Inhale 1 puff into the lungs daily. 05/21/22  Yes Jacqueline Delapena, Tarri Fuller D, MD  lisinopril (PRINIVIL,ZESTRIL) 10 MG tablet Take 10 mg by mouth daily.   Yes [provider]  lovastatin (MEVACOR) 40 MG tablet Take 1 tablet by mouth daily. 07/02/18  Yes [provider]  nystatin-triamcinolone ointment (MYCOLOG) Apply 1 application  topically 2 (two) times daily as needed (use when flying).   Yes [provider]  predniSONE (DELTASONE) 10 MG tablet 4 X 2 DAYS, 3 X 2 DAYS, 2 X 2 DAYS, 1 X 2 DAYS 05/21/22  Yes Baird Lyons D, MD  TURMERIC PO Take by mouth.   Yes [provider]  valACYclovir (VALTREX) 500 MG tablet Take one tablet by mouth twice daily for 3-5 days  for outbreak then as needed. 11/10/20  Yes Princess Bruins, MD  Vitamin D, Ergocalciferol, (DRISDOL) 1.25 MG (50000 UNIT) CAPS capsule Take 1 capsule (50,000 Units total) by mouth every 7 (seven) days. 02/27/22  Yes Princess Bruins, MD  famotidine (PEPCID) 20 MG tablet Take 1 tablet (20 mg total) by mouth 2 (two) times daily. Patient not taking: Reported on 05/21/2022 05/17/22   Melynda Ripple, MD  predniSONE (DELTASONE) 20 MG tablet  Take 2 tablets daily with breakfast. 05/14/22   Jaynee Eagles, PA-C   Past Medical History:  Diagnosis Date   Elevated cholesterol    History of colposcopy with cervical biopsy 2004, 2005   LGSIL   HSV (herpes simplex virus) infection    Hx gestational diabetes    Hypertension    Osteopenia 07/2017   T score -1.6 FRAX 17% / 1.8%.  Stable from prior DEXA   Past Surgical History:  Procedure Laterality Date   CESAREAN SECTION  06/15/1992   boy   [place] (R) ARM   Family History  Problem Relation Age of Onset   Diabetes Mother    Hypertension Mother    Heart disease Mother    Dementia Mother    Hypertension Father    Heart disease Father    Cancer Father        PROSTATE, BLADDER   Social History   Socioeconomic History   Marital status: Married    Spouse name: Not on file   Number of children: Not on file   Years of education: Not on file   Highest education level: Not on file  Occupational History   Not on file  Tobacco Use   Smoking status: Former    Passive exposure: Past   Smokeless tobacco: Never  Vaping Use   Vaping Use: Never used  Substance and Sexual Activity   Alcohol use: Yes    Alcohol/week: 7.0 standard drinks of alcohol    Types: 7 Standard drinks or equivalent per week   Drug use: No   Sexual activity: Yes    Partners: Male    Birth control/protection: Post-menopausal    Comment: 1st intercourse 66 yo-More than 5 partners  Other Topics Concern   Not on file  Social History Narrative   Not on file   Social Determinants of Health   Financial Resource Strain: Not on file  Food Insecurity: Not on file  Transportation Needs: Not on file  Physical Activity: Not on file  Stress: Not on file  Social Connections: Not on file  Intimate Partner Violence: Not on file   ROS-see HPI   + = positive Constitutional:    weight loss, night sweats, fevers, chills, fatigue, lassitude. HEENT:     headaches, difficulty swallowing, tooth/dental problems, sore throat,       sneezing, itching, ear ache, nasal congestion, post nasal drip, snoring CV:    chest pain, orthopnea, PND, swelling in lower extremities, anasarca,                                  dizziness, palpitations Resp:   shortness of breath with exertion or at rest.                productive cough,   +non-productive cough, coughing up of blood.  change in color of mucus. + wheezing.   Skin:    rash or lesions. GI:  No-   heartburn, indigestion, abdominal pain, nausea, vomiting, diarrhea,                 change in bowel habits, loss of appetite GU: dysuria, change in color of urine, no urgency or frequency.   flank pain. MS:   joint pain, stiffness, decreased range of motion, back pain. Neuro-     nothing unusual Psych:  change in mood or affect.  depression or anxiety.   memory loss.  OBJ- Physical Exam General- Alert, Oriented, Affect-appropriate, Distress- none acute Skin- rash-none, lesions- none, excoriation- none Lymphadenopathy- none Head- atraumatic            Eyes- Gross vision intact, PERRLA, conjunctivae and secretions clear            Ears- Hearing, canals-normal            Nose- Clear, no-Septal dev, mucus, polyps, erosion, perforation             Throat- Mallampati III , mucosa clear , drainage- none, tonsils- atrophic, +teeth Neck- flexible , trachea midline, no stridor , thyroid nl, carotid no bruit Chest - symmetrical excursion , unlabored           Heart/CV- RRR , no murmur , no gallop  , no rub, nl s1 s2                           - JVD- none , edema- none, stasis changes- none, varices- none           Lung- clear to P&A, wheeze- none, cough+dry , dullness-none, rub- none           Chest wall-  Abd-  Br/ Gen/ Rectal- Not done, not indicated Extrem- cyanosis- none, clubbing, none, atrophy- none, strength- nl Neuro- grossly intact to observation

## 2022-05-21 NOTE — Assessment & Plan Note (Signed)
Consistent with a postviral bronchitis.  She mostly describes dry cough and some wheeze.  If this does not clear up easily we will need to switch her off of lisinopril for possible ACE inhibitor cough. Plan- prednisone taper from 40 mg, samples Trelegy, tussionex

## 2022-05-27 DIAGNOSIS — M546 Pain in thoracic spine: Secondary | ICD-10-CM | POA: Diagnosis not present

## 2022-05-29 ENCOUNTER — Other Ambulatory Visit: Payer: PPO

## 2022-05-29 DIAGNOSIS — E559 Vitamin D deficiency, unspecified: Secondary | ICD-10-CM | POA: Diagnosis not present

## 2022-05-30 LAB — VITAMIN D 25 HYDROXY (VIT D DEFICIENCY, FRACTURES): Vit D, 25-Hydroxy: 61 ng/mL (ref 30–100)

## 2022-06-06 DIAGNOSIS — M546 Pain in thoracic spine: Secondary | ICD-10-CM | POA: Diagnosis not present

## 2022-06-13 DIAGNOSIS — M546 Pain in thoracic spine: Secondary | ICD-10-CM | POA: Diagnosis not present

## 2022-06-23 ENCOUNTER — Other Ambulatory Visit: Payer: Self-pay | Admitting: Nurse Practitioner

## 2022-06-24 NOTE — Telephone Encounter (Signed)
Med refill request: Valtrex 500 mg tab twice daily for 3-5 days with outbreak Last AEX: 01/23/22/ ML Next AEX: 02/01/23 Last MMG (if hormonal med) N/A Hx HSV   Refill authorized: Please Advise?

## 2022-07-02 DIAGNOSIS — M546 Pain in thoracic spine: Secondary | ICD-10-CM | POA: Diagnosis not present

## 2022-07-18 DIAGNOSIS — M546 Pain in thoracic spine: Secondary | ICD-10-CM | POA: Diagnosis not present

## 2022-07-26 DIAGNOSIS — J0140 Acute pansinusitis, unspecified: Secondary | ICD-10-CM | POA: Diagnosis not present

## 2022-08-12 DIAGNOSIS — R0981 Nasal congestion: Secondary | ICD-10-CM | POA: Diagnosis not present

## 2022-08-12 DIAGNOSIS — H6991 Unspecified Eustachian tube disorder, right ear: Secondary | ICD-10-CM | POA: Diagnosis not present

## 2022-08-12 DIAGNOSIS — J329 Chronic sinusitis, unspecified: Secondary | ICD-10-CM | POA: Diagnosis not present

## 2022-08-29 DIAGNOSIS — M5382 Other specified dorsopathies, cervical region: Secondary | ICD-10-CM | POA: Diagnosis not present

## 2022-08-29 DIAGNOSIS — M9902 Segmental and somatic dysfunction of thoracic region: Secondary | ICD-10-CM | POA: Diagnosis not present

## 2022-08-29 DIAGNOSIS — M62830 Muscle spasm of back: Secondary | ICD-10-CM | POA: Diagnosis not present

## 2022-08-29 DIAGNOSIS — M9901 Segmental and somatic dysfunction of cervical region: Secondary | ICD-10-CM | POA: Diagnosis not present

## 2022-08-29 DIAGNOSIS — M5136 Other intervertebral disc degeneration, lumbar region: Secondary | ICD-10-CM | POA: Diagnosis not present

## 2022-08-29 DIAGNOSIS — M9903 Segmental and somatic dysfunction of lumbar region: Secondary | ICD-10-CM | POA: Diagnosis not present

## 2022-09-11 DIAGNOSIS — M5382 Other specified dorsopathies, cervical region: Secondary | ICD-10-CM | POA: Diagnosis not present

## 2022-09-11 DIAGNOSIS — M5136 Other intervertebral disc degeneration, lumbar region: Secondary | ICD-10-CM | POA: Diagnosis not present

## 2022-09-11 DIAGNOSIS — M62830 Muscle spasm of back: Secondary | ICD-10-CM | POA: Diagnosis not present

## 2022-09-11 DIAGNOSIS — M9902 Segmental and somatic dysfunction of thoracic region: Secondary | ICD-10-CM | POA: Diagnosis not present

## 2022-09-11 DIAGNOSIS — M9903 Segmental and somatic dysfunction of lumbar region: Secondary | ICD-10-CM | POA: Diagnosis not present

## 2022-09-11 DIAGNOSIS — M9901 Segmental and somatic dysfunction of cervical region: Secondary | ICD-10-CM | POA: Diagnosis not present

## 2022-09-18 DIAGNOSIS — M5382 Other specified dorsopathies, cervical region: Secondary | ICD-10-CM | POA: Diagnosis not present

## 2022-09-18 DIAGNOSIS — M9903 Segmental and somatic dysfunction of lumbar region: Secondary | ICD-10-CM | POA: Diagnosis not present

## 2022-09-18 DIAGNOSIS — M9901 Segmental and somatic dysfunction of cervical region: Secondary | ICD-10-CM | POA: Diagnosis not present

## 2022-09-18 DIAGNOSIS — M9902 Segmental and somatic dysfunction of thoracic region: Secondary | ICD-10-CM | POA: Diagnosis not present

## 2022-09-18 DIAGNOSIS — M5136 Other intervertebral disc degeneration, lumbar region: Secondary | ICD-10-CM | POA: Diagnosis not present

## 2022-09-18 DIAGNOSIS — M62830 Muscle spasm of back: Secondary | ICD-10-CM | POA: Diagnosis not present

## 2022-09-24 DIAGNOSIS — M9902 Segmental and somatic dysfunction of thoracic region: Secondary | ICD-10-CM | POA: Diagnosis not present

## 2022-09-24 DIAGNOSIS — M9903 Segmental and somatic dysfunction of lumbar region: Secondary | ICD-10-CM | POA: Diagnosis not present

## 2022-09-24 DIAGNOSIS — M5382 Other specified dorsopathies, cervical region: Secondary | ICD-10-CM | POA: Diagnosis not present

## 2022-09-24 DIAGNOSIS — M5136 Other intervertebral disc degeneration, lumbar region: Secondary | ICD-10-CM | POA: Diagnosis not present

## 2022-09-24 DIAGNOSIS — M9901 Segmental and somatic dysfunction of cervical region: Secondary | ICD-10-CM | POA: Diagnosis not present

## 2022-09-24 DIAGNOSIS — M62830 Muscle spasm of back: Secondary | ICD-10-CM | POA: Diagnosis not present

## 2022-09-26 DIAGNOSIS — M5136 Other intervertebral disc degeneration, lumbar region: Secondary | ICD-10-CM | POA: Diagnosis not present

## 2022-09-26 DIAGNOSIS — M9902 Segmental and somatic dysfunction of thoracic region: Secondary | ICD-10-CM | POA: Diagnosis not present

## 2022-09-26 DIAGNOSIS — M5382 Other specified dorsopathies, cervical region: Secondary | ICD-10-CM | POA: Diagnosis not present

## 2022-09-26 DIAGNOSIS — M9901 Segmental and somatic dysfunction of cervical region: Secondary | ICD-10-CM | POA: Diagnosis not present

## 2022-09-26 DIAGNOSIS — M62830 Muscle spasm of back: Secondary | ICD-10-CM | POA: Diagnosis not present

## 2022-09-26 DIAGNOSIS — M9903 Segmental and somatic dysfunction of lumbar region: Secondary | ICD-10-CM | POA: Diagnosis not present

## 2022-10-02 DIAGNOSIS — M62830 Muscle spasm of back: Secondary | ICD-10-CM | POA: Diagnosis not present

## 2022-10-02 DIAGNOSIS — M5382 Other specified dorsopathies, cervical region: Secondary | ICD-10-CM | POA: Diagnosis not present

## 2022-10-02 DIAGNOSIS — M9901 Segmental and somatic dysfunction of cervical region: Secondary | ICD-10-CM | POA: Diagnosis not present

## 2022-10-02 DIAGNOSIS — M9903 Segmental and somatic dysfunction of lumbar region: Secondary | ICD-10-CM | POA: Diagnosis not present

## 2022-10-02 DIAGNOSIS — M5136 Other intervertebral disc degeneration, lumbar region: Secondary | ICD-10-CM | POA: Diagnosis not present

## 2022-10-02 DIAGNOSIS — M9902 Segmental and somatic dysfunction of thoracic region: Secondary | ICD-10-CM | POA: Diagnosis not present

## 2022-10-09 DIAGNOSIS — M62830 Muscle spasm of back: Secondary | ICD-10-CM | POA: Diagnosis not present

## 2022-10-09 DIAGNOSIS — M9903 Segmental and somatic dysfunction of lumbar region: Secondary | ICD-10-CM | POA: Diagnosis not present

## 2022-10-09 DIAGNOSIS — M9901 Segmental and somatic dysfunction of cervical region: Secondary | ICD-10-CM | POA: Diagnosis not present

## 2022-10-09 DIAGNOSIS — M5136 Other intervertebral disc degeneration, lumbar region: Secondary | ICD-10-CM | POA: Diagnosis not present

## 2022-10-09 DIAGNOSIS — M5382 Other specified dorsopathies, cervical region: Secondary | ICD-10-CM | POA: Diagnosis not present

## 2022-10-09 DIAGNOSIS — M9902 Segmental and somatic dysfunction of thoracic region: Secondary | ICD-10-CM | POA: Diagnosis not present

## 2022-10-16 ENCOUNTER — Other Ambulatory Visit (HOSPITAL_COMMUNITY): Payer: Self-pay | Admitting: Family Medicine

## 2022-10-16 DIAGNOSIS — Z8249 Family history of ischemic heart disease and other diseases of the circulatory system: Secondary | ICD-10-CM

## 2022-10-16 DIAGNOSIS — R7309 Other abnormal glucose: Secondary | ICD-10-CM | POA: Diagnosis not present

## 2022-10-16 DIAGNOSIS — E7800 Pure hypercholesterolemia, unspecified: Secondary | ICD-10-CM | POA: Diagnosis not present

## 2022-10-16 DIAGNOSIS — I10 Essential (primary) hypertension: Secondary | ICD-10-CM | POA: Diagnosis not present

## 2022-10-16 DIAGNOSIS — L853 Xerosis cutis: Secondary | ICD-10-CM | POA: Diagnosis not present

## 2022-10-16 DIAGNOSIS — F325 Major depressive disorder, single episode, in full remission: Secondary | ICD-10-CM | POA: Diagnosis not present

## 2022-10-16 DIAGNOSIS — E559 Vitamin D deficiency, unspecified: Secondary | ICD-10-CM | POA: Diagnosis not present

## 2022-10-16 DIAGNOSIS — L659 Nonscarring hair loss, unspecified: Secondary | ICD-10-CM | POA: Diagnosis not present

## 2022-10-17 DIAGNOSIS — M5382 Other specified dorsopathies, cervical region: Secondary | ICD-10-CM | POA: Diagnosis not present

## 2022-10-17 DIAGNOSIS — M9902 Segmental and somatic dysfunction of thoracic region: Secondary | ICD-10-CM | POA: Diagnosis not present

## 2022-10-17 DIAGNOSIS — M9901 Segmental and somatic dysfunction of cervical region: Secondary | ICD-10-CM | POA: Diagnosis not present

## 2022-10-17 DIAGNOSIS — M9903 Segmental and somatic dysfunction of lumbar region: Secondary | ICD-10-CM | POA: Diagnosis not present

## 2022-10-17 DIAGNOSIS — M62830 Muscle spasm of back: Secondary | ICD-10-CM | POA: Diagnosis not present

## 2022-10-17 DIAGNOSIS — M5136 Other intervertebral disc degeneration, lumbar region: Secondary | ICD-10-CM | POA: Diagnosis not present

## 2022-10-21 DIAGNOSIS — M546 Pain in thoracic spine: Secondary | ICD-10-CM | POA: Diagnosis not present

## 2022-10-23 DIAGNOSIS — M9903 Segmental and somatic dysfunction of lumbar region: Secondary | ICD-10-CM | POA: Diagnosis not present

## 2022-10-23 DIAGNOSIS — M9901 Segmental and somatic dysfunction of cervical region: Secondary | ICD-10-CM | POA: Diagnosis not present

## 2022-10-23 DIAGNOSIS — M62830 Muscle spasm of back: Secondary | ICD-10-CM | POA: Diagnosis not present

## 2022-10-23 DIAGNOSIS — M9902 Segmental and somatic dysfunction of thoracic region: Secondary | ICD-10-CM | POA: Diagnosis not present

## 2022-10-23 DIAGNOSIS — M5382 Other specified dorsopathies, cervical region: Secondary | ICD-10-CM | POA: Diagnosis not present

## 2022-10-23 DIAGNOSIS — M5136 Other intervertebral disc degeneration, lumbar region: Secondary | ICD-10-CM | POA: Diagnosis not present

## 2022-11-05 DIAGNOSIS — M9903 Segmental and somatic dysfunction of lumbar region: Secondary | ICD-10-CM | POA: Diagnosis not present

## 2022-11-05 DIAGNOSIS — M9902 Segmental and somatic dysfunction of thoracic region: Secondary | ICD-10-CM | POA: Diagnosis not present

## 2022-11-05 DIAGNOSIS — M5136 Other intervertebral disc degeneration, lumbar region: Secondary | ICD-10-CM | POA: Diagnosis not present

## 2022-11-05 DIAGNOSIS — M9901 Segmental and somatic dysfunction of cervical region: Secondary | ICD-10-CM | POA: Diagnosis not present

## 2022-11-05 DIAGNOSIS — M62830 Muscle spasm of back: Secondary | ICD-10-CM | POA: Diagnosis not present

## 2022-11-05 DIAGNOSIS — M5382 Other specified dorsopathies, cervical region: Secondary | ICD-10-CM | POA: Diagnosis not present

## 2022-11-13 DIAGNOSIS — M546 Pain in thoracic spine: Secondary | ICD-10-CM | POA: Diagnosis not present

## 2022-11-15 ENCOUNTER — Ambulatory Visit (HOSPITAL_BASED_OUTPATIENT_CLINIC_OR_DEPARTMENT_OTHER)
Admission: RE | Admit: 2022-11-15 | Discharge: 2022-11-15 | Disposition: A | Discharge status: Home / Self Care | Payer: PPO | Source: Ambulatory Visit | Attending: Family Medicine | Admitting: Family Medicine

## 2022-11-15 DIAGNOSIS — Z8249 Family history of ischemic heart disease and other diseases of the circulatory system: Secondary | ICD-10-CM | POA: Insufficient documentation

## 2022-11-18 DIAGNOSIS — M5136 Other intervertebral disc degeneration, lumbar region: Secondary | ICD-10-CM | POA: Diagnosis not present

## 2022-11-18 DIAGNOSIS — M62830 Muscle spasm of back: Secondary | ICD-10-CM | POA: Diagnosis not present

## 2022-11-18 DIAGNOSIS — M5382 Other specified dorsopathies, cervical region: Secondary | ICD-10-CM | POA: Diagnosis not present

## 2022-11-18 DIAGNOSIS — M9901 Segmental and somatic dysfunction of cervical region: Secondary | ICD-10-CM | POA: Diagnosis not present

## 2022-11-18 DIAGNOSIS — M9903 Segmental and somatic dysfunction of lumbar region: Secondary | ICD-10-CM | POA: Diagnosis not present

## 2022-11-18 DIAGNOSIS — M9902 Segmental and somatic dysfunction of thoracic region: Secondary | ICD-10-CM | POA: Diagnosis not present

## 2022-11-20 DIAGNOSIS — M9901 Segmental and somatic dysfunction of cervical region: Secondary | ICD-10-CM | POA: Diagnosis not present

## 2022-11-20 DIAGNOSIS — M9903 Segmental and somatic dysfunction of lumbar region: Secondary | ICD-10-CM | POA: Diagnosis not present

## 2022-11-20 DIAGNOSIS — M62830 Muscle spasm of back: Secondary | ICD-10-CM | POA: Diagnosis not present

## 2022-11-20 DIAGNOSIS — M5136 Other intervertebral disc degeneration, lumbar region: Secondary | ICD-10-CM | POA: Diagnosis not present

## 2022-11-20 DIAGNOSIS — M9902 Segmental and somatic dysfunction of thoracic region: Secondary | ICD-10-CM | POA: Diagnosis not present

## 2022-11-20 DIAGNOSIS — M5382 Other specified dorsopathies, cervical region: Secondary | ICD-10-CM | POA: Diagnosis not present

## 2022-11-27 DIAGNOSIS — M62830 Muscle spasm of back: Secondary | ICD-10-CM | POA: Diagnosis not present

## 2022-11-27 DIAGNOSIS — M9903 Segmental and somatic dysfunction of lumbar region: Secondary | ICD-10-CM | POA: Diagnosis not present

## 2022-11-27 DIAGNOSIS — M9902 Segmental and somatic dysfunction of thoracic region: Secondary | ICD-10-CM | POA: Diagnosis not present

## 2022-11-27 DIAGNOSIS — M9901 Segmental and somatic dysfunction of cervical region: Secondary | ICD-10-CM | POA: Diagnosis not present

## 2022-11-27 DIAGNOSIS — M5136 Other intervertebral disc degeneration, lumbar region: Secondary | ICD-10-CM | POA: Diagnosis not present

## 2022-11-27 DIAGNOSIS — M5382 Other specified dorsopathies, cervical region: Secondary | ICD-10-CM | POA: Diagnosis not present

## 2022-12-04 DIAGNOSIS — M5382 Other specified dorsopathies, cervical region: Secondary | ICD-10-CM | POA: Diagnosis not present

## 2022-12-04 DIAGNOSIS — M62830 Muscle spasm of back: Secondary | ICD-10-CM | POA: Diagnosis not present

## 2022-12-04 DIAGNOSIS — M9902 Segmental and somatic dysfunction of thoracic region: Secondary | ICD-10-CM | POA: Diagnosis not present

## 2022-12-04 DIAGNOSIS — M9901 Segmental and somatic dysfunction of cervical region: Secondary | ICD-10-CM | POA: Diagnosis not present

## 2022-12-04 DIAGNOSIS — M5136 Other intervertebral disc degeneration, lumbar region: Secondary | ICD-10-CM | POA: Diagnosis not present

## 2022-12-04 DIAGNOSIS — M9903 Segmental and somatic dysfunction of lumbar region: Secondary | ICD-10-CM | POA: Diagnosis not present

## 2022-12-09 ENCOUNTER — Other Ambulatory Visit: Payer: Self-pay | Admitting: Obstetrics & Gynecology

## 2022-12-09 DIAGNOSIS — M546 Pain in thoracic spine: Secondary | ICD-10-CM | POA: Diagnosis not present

## 2022-12-10 ENCOUNTER — Telehealth (HOSPITAL_BASED_OUTPATIENT_CLINIC_OR_DEPARTMENT_OTHER): Payer: Self-pay | Admitting: Cardiology

## 2022-12-10 NOTE — Telephone Encounter (Signed)
Medication refill request: premarin vaginal cream Last AEX:  01-23-22 Next AEX: 01-28-23 Last MMG (if hormonal medication request): 04-11-22 birads 1:neg Refill authorized: please approve if appropriate

## 2022-12-10 NOTE — Telephone Encounter (Signed)
Dr. Cristal Deer will not be in the office on Friday 01/31/23---10:40 am New Patient appointment ---rescheduled to Friday 02/07/23 at 10:40 am.  Patient voiced her understanding and has been placed on the wait list

## 2022-12-23 DIAGNOSIS — M546 Pain in thoracic spine: Secondary | ICD-10-CM | POA: Diagnosis not present

## 2023-01-07 DIAGNOSIS — M546 Pain in thoracic spine: Secondary | ICD-10-CM | POA: Diagnosis not present

## 2023-01-20 DIAGNOSIS — M546 Pain in thoracic spine: Secondary | ICD-10-CM | POA: Diagnosis not present

## 2023-01-28 ENCOUNTER — Ambulatory Visit: Payer: PPO | Admitting: Obstetrics & Gynecology

## 2023-01-31 ENCOUNTER — Encounter (HOSPITAL_BASED_OUTPATIENT_CLINIC_OR_DEPARTMENT_OTHER): Payer: Self-pay

## 2023-01-31 ENCOUNTER — Ambulatory Visit (HOSPITAL_BASED_OUTPATIENT_CLINIC_OR_DEPARTMENT_OTHER): Payer: PPO | Admitting: Cardiology

## 2023-01-31 DIAGNOSIS — G4733 Obstructive sleep apnea (adult) (pediatric): Secondary | ICD-10-CM | POA: Insufficient documentation

## 2023-01-31 DIAGNOSIS — F419 Anxiety disorder, unspecified: Secondary | ICD-10-CM

## 2023-01-31 DIAGNOSIS — I10 Essential (primary) hypertension: Secondary | ICD-10-CM

## 2023-01-31 DIAGNOSIS — F32A Depression, unspecified: Secondary | ICD-10-CM | POA: Insufficient documentation

## 2023-02-07 ENCOUNTER — Other Ambulatory Visit (HOSPITAL_BASED_OUTPATIENT_CLINIC_OR_DEPARTMENT_OTHER): Payer: Self-pay

## 2023-02-07 ENCOUNTER — Encounter (HOSPITAL_BASED_OUTPATIENT_CLINIC_OR_DEPARTMENT_OTHER): Payer: Self-pay | Admitting: Cardiology

## 2023-02-07 ENCOUNTER — Ambulatory Visit (HOSPITAL_BASED_OUTPATIENT_CLINIC_OR_DEPARTMENT_OTHER): Payer: PPO | Admitting: Cardiology

## 2023-02-07 VITALS — BP 124/78 | HR 76 | Ht 66.0 in | Wt 129.8 lb

## 2023-02-07 DIAGNOSIS — Z7182 Exercise counseling: Secondary | ICD-10-CM | POA: Diagnosis not present

## 2023-02-07 DIAGNOSIS — Z713 Dietary counseling and surveillance: Secondary | ICD-10-CM

## 2023-02-07 DIAGNOSIS — I2510 Atherosclerotic heart disease of native coronary artery without angina pectoris: Secondary | ICD-10-CM

## 2023-02-07 DIAGNOSIS — Z8249 Family history of ischemic heart disease and other diseases of the circulatory system: Secondary | ICD-10-CM | POA: Diagnosis not present

## 2023-02-07 DIAGNOSIS — E7800 Pure hypercholesterolemia, unspecified: Secondary | ICD-10-CM

## 2023-02-07 DIAGNOSIS — Z712 Person consulting for explanation of examination or test findings: Secondary | ICD-10-CM | POA: Diagnosis not present

## 2023-02-07 DIAGNOSIS — Z7189 Other specified counseling: Secondary | ICD-10-CM | POA: Diagnosis not present

## 2023-02-07 MED ORDER — ROSUVASTATIN CALCIUM 10 MG PO TABS
10.0000 mg | ORAL_TABLET | Freq: Every day | ORAL | 3 refills | Status: DC
  Filled 2023-02-07 (×2): qty 90, 90d supply, fill #0

## 2023-02-07 MED ORDER — ASPIRIN 81 MG PO TBEC: 81.0000 mg | DELAYED_RELEASE_TABLET | Freq: Every day | ORAL | Status: AC

## 2023-02-07 NOTE — Progress Notes (Signed)
Cardiology Office Note:  .    Date:  02/07/2023  ID:  Kristina Moody, DOB [birth year], MRN [id number] PCP: Merri Brunette, MD  [facility] Providers Cardiologist:  Jodelle Red, MD     History of Present Illness: .    Kristina Moody is a 67 y.o. female with a hx of CAD, aortic atherosclerosis, hypertension, hyperlipidemia, PVC's, OSA, vitamin D deficiency, depression, anxiety, here for the evaluation of elevated coronary calcium score with family history of heart disease.  Previously seen by Dr. Katrinka Blazing 08/2016 for multiyear history of occasional palpitations described as a flutter. Echo and heart monitor were ordered, results as below. Palpitations correlated with PVC's.  Referral notes from Dr. Katrinka Blazing personally reviewed. She was seen 10/16/2022. Blood pressures were well controlled at home on lisinopril and amlodipine. Her younger brother had recently had a coronary CT that showed CAD and she wished to proceed with the same. She had a coronary calcium score of 121 and was referred to cardiology for further evaluation.  Cardiovascular risk factors: Prior clinical ASCVD: CAD per CT 11/2022 with coronary calcium score of 121 and aortic atherosclerosis. No heart attack or stroke. Comorbid conditions: Hypertension - In the office her BP is 124/78 on lisinopril 20 mg and amlodipine 2.5 mg daily. Hyperlipidemia - on lovastatin 40 mg daily for years. Her LDL was 144 as of 10/16/22. Metabolic syndrome/Obesity:  Highest adult weight is her current weight 129 lbs. Chronic inflammatory conditions: Arthritis, she is treating with tumeric. Only had one steroidal injection in the past.  Tobacco use history: Former smoker, she quit 30+ years ago. Family history: Her mother had CABG x4 at 51 yo, at 51 yo she had a pacemaker, died at 69 yo with dementia. Her father had a TIA, one heart attack with one stent placed; he died of leukemia CLL at 69 yo. Her maternal grandmother had many heart attacks,  died of a heart attack in her mid 62. Her maternal grandfather died of a stroke. Her paternal grandfather died of a stroke at 27 yo. Her younger brother is 75 and also recently completed a coronary CT with CAD, subsequently underwent heart cath with inoperable blockages, now on Coreg. Also has one younger sister. Prior pertinent testing and/or incidental findings: Echo 09/2016 showed LVEF 60-65%, trivial mitral regurgitation. She wore a monitor 09/2016 revealing NSR with occasional isolated PVC's; her complaints of flutters were noted to correlate with the PVC's.  Exercise level: She enjoys walking for exercise, occasionally participates in [employer]. Working with a physical therapist once a month for grip/posture. Never had anginal symptoms.  Current diet: "Fairly healthy." She states that she eats more beef than she should, enjoys grilled foods. Frequently eats chicken.  Previously on hormone replacement therapy, this had been stopped several years ago.  She states that she is a terrible sleeper. Prior sleep study was reportedly notable for borderline sleep apnea. She was intolerant of CPAP, now uses breathe right nasal strips.  She denies any palpitations, chest pain, peripheral edema, lightheadedness, headaches, syncope.  ROS:  Please see the history of present illness. ROS otherwise negative except as noted.  (+) Easy bruising  Studies Reviewed: Marland Kitchen    EKG Interpretation Date/Time:  Friday February 07 2023 10:53:03 EDT Ventricular Rate:  76 PR Interval:  184 QRS Duration:  76 QT Interval:  380 QTC Calculation: 427 R Axis:   64  Text Interpretation: Normal sinus rhythm Low anterior forces No previous ECGs available Confirmed by Jodelle Red [phone])  on 02/07/2023 11:01:46 AM    CT Cardiac Scoring  11/15/2022: IMPRESSION: 1. Coronary calcium score of 121. This was 77th percentile for age, gender, and race matched controls.   2. Aortic atherosclerosis.  Physical Exam:     VS:  BP 124/78   Pulse 76   Ht 5\' 6"  (1.676 m)   Wt 129 lb 12.8 oz (58.9 kg)   LMP 09/11/2008   SpO2 98%   BMI 20.95 kg/m    Wt Readings from Last 3 Encounters:  02/07/23 129 lb 12.8 oz (58.9 kg)  05/21/22 131 lb 9.6 oz (59.7 kg)  01/23/22 136 lb (61.7 kg)    GEN: Well nourished, well developed in no acute distress HEENT: Normal, moist mucous membranes NECK: No JVD CARDIAC: regular rhythm, normal S1 and S2, no rubs or gallops. No murmur. VASCULAR: Radial and DP pulses 2+ bilaterally. No carotid bruits RESPIRATORY:  Clear to auscultation without rales, wheezing or rhonchi  ABDOMEN: Soft, non-tender, non-distended MUSCULOSKELETAL:  Ambulates independently SKIN: Warm and dry, no edema NEUROLOGIC:  Alert and oriented x 3. No focal neuro deficits noted. PSYCHIATRIC:  Normal affect   ASSESSMENT AND PLAN: .    Coronary calcification consistent with nonobstructive CAD Family history of heart disease Hypercholesterolemia -We reviewed the calcium score at length, including images as well as the graph showing mortality based on calcium score. We discussed the pathophysiology of cholesterol plaque formation, the role of calcium and why it is a marker, how plaque is key to acute MI/CVA, and how known plaque is managed with medications.   -we discussed the data on statins, both in terms of their long term benefit as well as the risk of side effects. Reviewed common misconceptions about statins. Reviewed how we monitor treatment. After shared decision making, patient is agreeable to increasing intensity of statin.  -discussed guidelines recommending aspirin. After shared decision making, patient is amenable. Discussed watching for signs of bleeding -reviewed red flag warning signs that need immediate medical attention  -will stop lovastatin, start rosuvastatin 10 mg and 81 mg ASA. Lipid panel in 3 months. -last LDL 144 on lovastatin 40 mg daily. Goal LDL <70  CV risk counseling and  prevention: discussed at length today -recommend heart healthy/Mediterranean diet, with whole grains, fruits, vegetable, fish, lean meats, nuts, and olive oil. Limit salt. -recommend moderate walking, 3-5 times/week for 30-50 minutes each session. Aim for at least 150 minutes.week. Goal should be pace of 3 miles/hours, or walking 1.5 miles in 30 minutes -recommend avoidance of tobacco products. Avoid excess alcohol.  Dispo: Follow-up in 1 year, or sooner as needed.  I,Mathew Stumpf,acting as a Neurosurgeon for [employer], MD.,have documented all relevant documentation on the behalf of Jodelle Red, MD,as directed by  Jodelle Red, MD while in the presence of Jodelle Red, MD.  I, Jodelle Red, MD, have reviewed all documentation for this visit. The documentation on 02/07/23 for the exam, diagnosis, procedures, and orders are all accurate and complete.   Signed, Jodelle Red, MD

## 2023-02-07 NOTE — Patient Instructions (Signed)
Medication Instructions:   STOP Lovastatin  START Rosuvastatin 10 mg daily  START Aspirin 81 mg daily  *If you need a refill on your cardiac medications before your next appointment, please call your pharmacy*   Lab Work: Your physician recommends that you return for a FASTING lipid profile in 3 months.  If you have labs (blood work) drawn today and your tests are completely normal, you will receive your results only by: MyChart Message (if you have MyChart) OR A paper copy in the mail If you have any lab test that is abnormal or we need to change your treatment, we will call you to review the results.   Follow-Up: At [facility], you and your health needs are our priority.  As part of our continuing mission to provide you with exceptional heart care, we have created designated Provider Care Teams.  These Care Teams include your primary Cardiologist (physician) and Advanced Practice Providers (APPs -  Physician Assistants and Nurse Practitioners) who all work together to provide you with the care you need, when you need it.  We recommend signing up for the patient portal called "MyChart".  Sign up information is provided on this After Visit Summary.  MyChart is used to connect with patients for Virtual Visits (Telemedicine).  Patients are able to view lab/test results, encounter notes, upcoming appointments, etc.  Non-urgent messages can be sent to your provider as well.   To learn more about what you can do with MyChart, go to [email or web address].    Your next appointment:   1 year(s)  Provider:   Jodelle Red, MD

## 2023-02-20 DIAGNOSIS — M546 Pain in thoracic spine: Secondary | ICD-10-CM | POA: Diagnosis not present

## 2023-03-10 ENCOUNTER — Encounter: Payer: Self-pay | Admitting: Obstetrics and Gynecology

## 2023-03-10 ENCOUNTER — Ambulatory Visit (INDEPENDENT_AMBULATORY_CARE_PROVIDER_SITE_OTHER): Payer: PPO | Admitting: Obstetrics and Gynecology

## 2023-03-10 ENCOUNTER — Other Ambulatory Visit (HOSPITAL_COMMUNITY)
Admission: RE | Admit: 2023-03-10 | Discharge: 2023-03-10 | Disposition: A | Discharge status: Home / Self Care | Payer: PPO | Source: Ambulatory Visit | Attending: Obstetrics and Gynecology | Admitting: Obstetrics and Gynecology

## 2023-03-10 VITALS — BP 116/74 | HR 70 | Ht 64.75 in | Wt 132.0 lb

## 2023-03-10 DIAGNOSIS — Z1151 Encounter for screening for human papillomavirus (HPV): Secondary | ICD-10-CM | POA: Insufficient documentation

## 2023-03-10 DIAGNOSIS — M546 Pain in thoracic spine: Secondary | ICD-10-CM | POA: Diagnosis not present

## 2023-03-10 DIAGNOSIS — Z9289 Personal history of other medical treatment: Secondary | ICD-10-CM

## 2023-03-10 DIAGNOSIS — Z01419 Encounter for gynecological examination (general) (routine) without abnormal findings: Secondary | ICD-10-CM | POA: Diagnosis not present

## 2023-03-10 DIAGNOSIS — B009 Herpesviral infection, unspecified: Secondary | ICD-10-CM

## 2023-03-10 DIAGNOSIS — Z9189 Other specified personal risk factors, not elsewhere classified: Secondary | ICD-10-CM

## 2023-03-10 DIAGNOSIS — Z1211 Encounter for screening for malignant neoplasm of colon: Secondary | ICD-10-CM

## 2023-03-10 DIAGNOSIS — Z124 Encounter for screening for malignant neoplasm of cervix: Secondary | ICD-10-CM | POA: Diagnosis not present

## 2023-03-10 DIAGNOSIS — N950 Postmenopausal bleeding: Secondary | ICD-10-CM | POA: Diagnosis not present

## 2023-03-10 DIAGNOSIS — Z1231 Encounter for screening mammogram for malignant neoplasm of breast: Secondary | ICD-10-CM

## 2023-03-10 MED ORDER — ESTRADIOL 10 MCG VA TABS: 1.0000 | ORAL_TABLET | VAGINAL | 12 refills | Status: AC

## 2023-03-10 MED ORDER — ESTRADIOL 10 MCG VA TABS: 1.0000 | ORAL_TABLET | VAGINAL | 11 refills | Status: DC

## 2023-03-10 NOTE — Patient Instructions (Signed)
The vaginal ultrasound order has been placed, all you need to do is call the scheduling desk at [phone] to make the appointment at one of our outpatient imaging centers at your earliest convenience. This is to evaluate your uterus and ovaries.  The report will go directly to epic after.  PLEASE also call the office with the date you have the ultrasound scheduled, so we can schedule a follow-up to discuss results with you.  Dr. Karma Greaser

## 2023-03-10 NOTE — Progress Notes (Signed)
67 y.o. y.o. female here for annual exam. She repots one episode of postcoital spotting.   Patient's last menstrual period was 09/11/2008.   HPI: Postmenopause, on no systemic HRT.   No pelvic pain.  Vaginal dryness with IC, recommend coconut oil.  On Vagifem twice a week.  Pap 11/2020 Neg.  No h/o abnormal Pap.  No indication to repeat Pap at this time. Breasts normal.  Mammo Neg 03/2021.  Urine/BMs normal.  BMI good at 22.63.  Doing PT, Yoga and stretching.  Health labs with Fam MD.  Alen Bleacher 2020.  BD Osteopenia T-Score -1.6 in 09/2019.  Will repeat BD here now.    Last mammogram: 2023 Last colonoscopy: repeat end of this year  Blood pressure 116/74, pulse 70, height 5' 4.75" (1.645 m), weight 132 lb (59.9 kg), last menstrual period 09/11/2008, SpO2 97%.     Component Value Date/Time   DIAGPAP  11/24/2020 1122    - Negative for intraepithelial lesion or malignancy (NILM)   ADEQPAP  11/24/2020 1122    Satisfactory for evaluation; transformation zone component PRESENT.    GYN HISTORY:    Component Value Date/Time   DIAGPAP  11/24/2020 1122    - Negative for intraepithelial lesion or malignancy (NILM)   ADEQPAP  11/24/2020 1122    Satisfactory for evaluation; transformation zone component PRESENT.    OB History  Gravida Para Term Preterm AB Living  4 2 2   2 2   SAB IAB Ectopic Multiple Live Births  2   0        # Outcome Date GA Lbr Len/2nd Weight Sex Type Anes PTL Lv  4 SAB           3 SAB           2 Term           1 Term             Past Medical History:  Diagnosis Date   Anxiety    Depression    Elevated cholesterol    History of colposcopy with cervical biopsy 2004, 2005   LGSIL   HSV (herpes simplex virus) infection    Hx gestational diabetes    Hypertension    Insomnia    Osteopenia 07/2017   T score -1.6 FRAX 17% / 1.8%.  Stable from prior DEXA    Past Surgical History:  Procedure Laterality Date   CESAREAN SECTION  06/15/1992   boy   COLPOSCOPY      COSMETIC SURGERY     SKIN SURGERY     BIRTH MARK REMOVED (R) ARM    Current Outpatient Medications on File Prior to Visit  Medication Sig Dispense Refill   albuterol (PROVENTIL HFA;VENTOLIN HFA) 108 (90 Base) MCG/ACT inhaler Inhale 2 puffs into the lungs every 4 to 6 hours as needed for wheezing or shortness of breath.     amLODipine (NORVASC) 2.5 MG tablet Take 1 tablet by mouth daily.     aspirin EC 81 MG tablet Take 1 tablet (81 mg total) by mouth daily. Swallow whole.     CALCIUM PO Take 600 mg by mouth daily. Reported on 06/03/2015     Cholecalciferol (VITAMIN D) 2000 units CAPS Take 2,000 Units by mouth daily.     citalopram (CELEXA) 20 MG tablet Take 20 mg by mouth daily.     clonazePAM (KLONOPIN) 0.5 MG tablet Take 0.5 mg by mouth at bedtime.     Estradiol (VAGIFEM) 10 MCG  TABS vaginal tablet Place 1 tablet vaginally 2 (two) times a week.     lisinopril (ZESTRIL) 20 MG tablet Take 20 mg by mouth daily.     rosuvastatin (CRESTOR) 10 MG tablet Take 1 tablet (10 mg total) by mouth daily. 90 tablet 3   TURMERIC PO Take by mouth.     No current facility-administered medications on file prior to visit.    Social History   Socioeconomic History   Marital status: Married    Spouse name: Not on file   Number of children: Not on file   Years of education: Not on file   Highest education level: Not on file  Occupational History   Not on file  Tobacco Use   Smoking status: Former    Passive exposure: Past   Smokeless tobacco: Never  Vaping Use   Vaping status: Never Used  Substance and Sexual Activity   Alcohol use: Yes    Alcohol/week: 7.0 standard drinks of alcohol    Types: 7 Standard drinks or equivalent per week   Drug use: No   Sexual activity: Yes    Partners: Male    Birth control/protection: Post-menopausal    Comment: 1st intercourse 67 yo-More than 5 partners  Other Topics Concern   Not on file  Social History Narrative   Not on file   Social  Determinants of Health   Financial Resource Strain: Not on file  Food Insecurity: Not on file  Transportation Needs: Not on file  Physical Activity: Not on file  Stress: Not on file  Social Connections: Not on file  Intimate Partner Violence: Not on file    Family History  Problem Relation Age of Onset   Diabetes Mother    Hypertension Mother    Heart disease Mother    Dementia Mother    Hypertension Father    Heart disease Father    Cancer Father        PROSTATE, BLADDER     Allergies  Allergen Reactions   Codeine Itching      Patient's last menstrual period was Patient's last menstrual period was 09/11/2008.Marland Kitchen             Review of Systems Alls systems reviewed and are negative.     PE General appearance: alert, cooperative and appears stated age Head: Normocephalic, without obvious abnormality, atraumatic Neck: no adenopathy, supple, symmetrical, trachea midline and thyroid normal to inspection and palpation Lungs: clear to auscultation bilaterally Breasts: normal appearance, no masses or tenderness Heart: regular rate and rhythm Abdomen: soft, non-tender; bowel sounds normal; no masses,  no organomegaly Extremities: extremities normal, atraumatic, no cyanosis or edema Skin: Skin color, texture, turgor normal. No rashes or lesions Lymph nodes: Cervical, supraclavicular, and axillary nodes normal. No abnormal inguinal nodes palpated Neurologic: Grossly normal     Pelvic: External genitalia:  no lesions              Urethra:  normal appearing urethra with no masses, tenderness or lesions              Bartholins and Skenes: normal                 Vagina: normal appearing vagina with normal color and discharge, no lesions.               Cervix: no lesions, no cervical motion tenderness               Bimanual Exam:  Uterus:  normal  size,  irregulkar contour, position, consistency, mobility, non-tender              Adnexa: no mass, fullness, tenderness           Chaperone was present for exam.   A:         Well Woman GYN exam, PM spotting x1                             P:        Pap smear collected             Encouraged annual mammogram screening             Colonoscopy scheduled at the end of the year             Labs and immunizations with her primary             TV US to evaluate the endometrial lining             Encouraged safe sexual practices and enouraged healthy lifestyle practices with diet and exercise  Earley Favor

## 2023-03-13 LAB — CYTOLOGY - PAP
Comment: NEGATIVE
Diagnosis: NEGATIVE
High risk HPV: NEGATIVE

## 2023-03-17 ENCOUNTER — Ambulatory Visit (HOSPITAL_BASED_OUTPATIENT_CLINIC_OR_DEPARTMENT_OTHER)
Admission: RE | Admit: 2023-03-17 | Discharge: 2023-03-17 | Disposition: A | Discharge status: Home / Self Care | Payer: PPO | Source: Ambulatory Visit | Attending: Obstetrics and Gynecology | Admitting: Obstetrics and Gynecology

## 2023-03-17 DIAGNOSIS — Z780 Asymptomatic menopausal state: Secondary | ICD-10-CM | POA: Diagnosis not present

## 2023-03-17 DIAGNOSIS — N939 Abnormal uterine and vaginal bleeding, unspecified: Secondary | ICD-10-CM | POA: Diagnosis not present

## 2023-03-17 DIAGNOSIS — N950 Postmenopausal bleeding: Secondary | ICD-10-CM | POA: Diagnosis not present

## 2023-03-17 DIAGNOSIS — D259 Leiomyoma of uterus, unspecified: Secondary | ICD-10-CM | POA: Diagnosis not present

## 2023-03-18 DIAGNOSIS — I700 Atherosclerosis of aorta: Secondary | ICD-10-CM | POA: Diagnosis not present

## 2023-03-18 DIAGNOSIS — E7800 Pure hypercholesterolemia, unspecified: Secondary | ICD-10-CM | POA: Diagnosis not present

## 2023-03-18 DIAGNOSIS — I10 Essential (primary) hypertension: Secondary | ICD-10-CM | POA: Diagnosis not present

## 2023-03-18 DIAGNOSIS — G4700 Insomnia, unspecified: Secondary | ICD-10-CM | POA: Diagnosis not present

## 2023-03-18 DIAGNOSIS — F325 Major depressive disorder, single episode, in full remission: Secondary | ICD-10-CM | POA: Diagnosis not present

## 2023-03-18 DIAGNOSIS — Z0000 Encounter for general adult medical examination without abnormal findings: Secondary | ICD-10-CM | POA: Diagnosis not present

## 2023-03-18 DIAGNOSIS — Z1331 Encounter for screening for depression: Secondary | ICD-10-CM | POA: Diagnosis not present

## 2023-03-20 DIAGNOSIS — H40053 Ocular hypertension, bilateral: Secondary | ICD-10-CM | POA: Diagnosis not present

## 2023-03-20 DIAGNOSIS — H5213 Myopia, bilateral: Secondary | ICD-10-CM | POA: Diagnosis not present

## 2023-03-21 DIAGNOSIS — M546 Pain in thoracic spine: Secondary | ICD-10-CM | POA: Diagnosis not present

## 2023-03-25 DIAGNOSIS — L814 Other melanin hyperpigmentation: Secondary | ICD-10-CM | POA: Diagnosis not present

## 2023-03-25 DIAGNOSIS — L853 Xerosis cutis: Secondary | ICD-10-CM | POA: Diagnosis not present

## 2023-03-25 DIAGNOSIS — D2262 Melanocytic nevi of left upper limb, including shoulder: Secondary | ICD-10-CM | POA: Diagnosis not present

## 2023-03-25 DIAGNOSIS — D225 Melanocytic nevi of trunk: Secondary | ICD-10-CM | POA: Diagnosis not present

## 2023-03-25 DIAGNOSIS — L570 Actinic keratosis: Secondary | ICD-10-CM | POA: Diagnosis not present

## 2023-03-25 DIAGNOSIS — L820 Inflamed seborrheic keratosis: Secondary | ICD-10-CM | POA: Diagnosis not present

## 2023-03-25 DIAGNOSIS — D2239 Melanocytic nevi of other parts of face: Secondary | ICD-10-CM | POA: Diagnosis not present

## 2023-03-25 DIAGNOSIS — L821 Other seborrheic keratosis: Secondary | ICD-10-CM | POA: Diagnosis not present

## 2023-04-02 DIAGNOSIS — K5730 Diverticulosis of large intestine without perforation or abscess without bleeding: Secondary | ICD-10-CM | POA: Diagnosis not present

## 2023-04-02 DIAGNOSIS — Z8371 Family history of adenomatous and serrated polyps: Secondary | ICD-10-CM | POA: Diagnosis not present

## 2023-04-02 DIAGNOSIS — K649 Unspecified hemorrhoids: Secondary | ICD-10-CM | POA: Diagnosis not present

## 2023-04-02 DIAGNOSIS — Z1211 Encounter for screening for malignant neoplasm of colon: Secondary | ICD-10-CM | POA: Diagnosis not present

## 2023-04-09 ENCOUNTER — Ambulatory Visit: Payer: PPO | Admitting: Obstetrics and Gynecology

## 2023-04-14 ENCOUNTER — Ambulatory Visit
Admission: RE | Admit: 2023-04-14 | Discharge: 2023-04-14 | Disposition: A | Discharge status: Home / Self Care | Payer: PPO | Source: Ambulatory Visit | Attending: Obstetrics and Gynecology | Admitting: Obstetrics and Gynecology

## 2023-04-14 DIAGNOSIS — Z01419 Encounter for gynecological examination (general) (routine) without abnormal findings: Secondary | ICD-10-CM

## 2023-04-14 DIAGNOSIS — Z1231 Encounter for screening mammogram for malignant neoplasm of breast: Secondary | ICD-10-CM

## 2023-04-21 DIAGNOSIS — M546 Pain in thoracic spine: Secondary | ICD-10-CM | POA: Diagnosis not present

## 2023-04-29 DIAGNOSIS — M546 Pain in thoracic spine: Secondary | ICD-10-CM | POA: Diagnosis not present

## 2023-05-01 ENCOUNTER — Other Ambulatory Visit (HOSPITAL_BASED_OUTPATIENT_CLINIC_OR_DEPARTMENT_OTHER): Payer: Self-pay

## 2023-05-12 DIAGNOSIS — M546 Pain in thoracic spine: Secondary | ICD-10-CM | POA: Diagnosis not present

## 2023-05-26 DIAGNOSIS — M546 Pain in thoracic spine: Secondary | ICD-10-CM | POA: Diagnosis not present

## 2023-06-09 DIAGNOSIS — M546 Pain in thoracic spine: Secondary | ICD-10-CM | POA: Diagnosis not present

## 2023-06-26 DIAGNOSIS — M5136 Other intervertebral disc degeneration, lumbar region with discogenic back pain only: Secondary | ICD-10-CM | POA: Diagnosis not present

## 2023-06-26 DIAGNOSIS — M9902 Segmental and somatic dysfunction of thoracic region: Secondary | ICD-10-CM | POA: Diagnosis not present

## 2023-06-26 DIAGNOSIS — M9903 Segmental and somatic dysfunction of lumbar region: Secondary | ICD-10-CM | POA: Diagnosis not present

## 2023-06-26 DIAGNOSIS — M62830 Muscle spasm of back: Secondary | ICD-10-CM | POA: Diagnosis not present

## 2023-06-26 DIAGNOSIS — M9901 Segmental and somatic dysfunction of cervical region: Secondary | ICD-10-CM | POA: Diagnosis not present

## 2023-06-26 DIAGNOSIS — M5382 Other specified dorsopathies, cervical region: Secondary | ICD-10-CM | POA: Diagnosis not present

## 2023-08-20 DIAGNOSIS — M546 Pain in thoracic spine: Secondary | ICD-10-CM | POA: Diagnosis not present

## 2023-08-25 DIAGNOSIS — L239 Allergic contact dermatitis, unspecified cause: Secondary | ICD-10-CM | POA: Diagnosis not present

## 2023-08-26 DIAGNOSIS — M546 Pain in thoracic spine: Secondary | ICD-10-CM | POA: Diagnosis not present

## 2023-08-28 DIAGNOSIS — L2089 Other atopic dermatitis: Secondary | ICD-10-CM | POA: Diagnosis not present

## 2023-08-28 DIAGNOSIS — Z79899 Other long term (current) drug therapy: Secondary | ICD-10-CM | POA: Diagnosis not present

## 2023-09-01 DIAGNOSIS — E7800 Pure hypercholesterolemia, unspecified: Secondary | ICD-10-CM | POA: Diagnosis not present

## 2023-09-01 DIAGNOSIS — I2510 Atherosclerotic heart disease of native coronary artery without angina pectoris: Secondary | ICD-10-CM | POA: Diagnosis not present

## 2023-09-02 LAB — LIPID PANEL
Chol/HDL Ratio: 2.8 ratio (ref 0.0–4.4)
Cholesterol, Total: 224 mg/dL — ABNORMAL HIGH (ref 100–199)
HDL: 79 mg/dL (ref >39)
LDL Chol Calc (NIH): 132 mg/dL — ABNORMAL HIGH (ref 0–99)
Triglycerides: 75 mg/dL (ref 0–149)
VLDL Cholesterol Cal: 13 mg/dL (ref 5–40)

## 2023-09-04 DIAGNOSIS — L308 Other specified dermatitis: Secondary | ICD-10-CM | POA: Diagnosis not present

## 2023-09-04 DIAGNOSIS — I10 Essential (primary) hypertension: Secondary | ICD-10-CM | POA: Diagnosis not present

## 2023-09-04 DIAGNOSIS — E7800 Pure hypercholesterolemia, unspecified: Secondary | ICD-10-CM | POA: Diagnosis not present

## 2023-09-09 ENCOUNTER — Other Ambulatory Visit (HOSPITAL_BASED_OUTPATIENT_CLINIC_OR_DEPARTMENT_OTHER): Payer: Self-pay

## 2023-09-09 ENCOUNTER — Telehealth (HOSPITAL_BASED_OUTPATIENT_CLINIC_OR_DEPARTMENT_OTHER): Payer: Self-pay

## 2023-09-09 DIAGNOSIS — E7800 Pure hypercholesterolemia, unspecified: Secondary | ICD-10-CM

## 2023-09-09 NOTE — Telephone Encounter (Signed)
-----   Message from [facility] sent at 09/08/2023  5:45 PM EDT ----- Cholesterol goal is for LDL to be less than 70. Unfortunately it is still elevated at 132. Easiest option would be to increase the rosuvastatin. Current dose is 10 mg--if this is being taken daily, then would increase dose to rosuvastatin 20 mg daily and recheck lipids in about 2-3 months. The computer notes that this prescription may have ended--can we confirm this is still being taken?

## 2023-09-09 NOTE — Telephone Encounter (Signed)
 Called and spoke to pt. Last name and DOB verified. Discussed lab results and MD's recommendations. Patient has not taken Crestor for approximately 2 months ago due to rash that covered 70% of her body. Currently working with PCP and Dermatology to determine cause of rash. Pt requested not to begin another statin due to derm doctor stating statins could be a cause of rash. Pt would like an alternative. Pt in agreement to get repeat labs in 2-3 months. Pt has been trying to eat healthier and exercise more with husband.   Will make MD aware.

## 2023-09-10 MED ORDER — EZETIMIBE 10 MG PO TABS: 10.0000 mg | ORAL_TABLET | Freq: Every day | ORAL | 3 refills | Status: AC

## 2023-09-10 NOTE — Addendum Note (Signed)
 Addended by: Gabriel Rung on: 09/10/2023 12:51 PM   Modules accepted: Orders

## 2023-09-10 NOTE — Telephone Encounter (Signed)
 Called and spoke to pt. Discussed MD's recommendations and she is in agreement. Confirmed local pharmacy. New rx sent. Advised pt to get repeat labs after taking medication for 2-3 months. Any and all questions were answered. Pt verbalized understanding and was thankful for call.

## 2023-09-30 DIAGNOSIS — E559 Vitamin D deficiency, unspecified: Secondary | ICD-10-CM | POA: Diagnosis not present

## 2023-10-01 DIAGNOSIS — L2089 Other atopic dermatitis: Secondary | ICD-10-CM | POA: Diagnosis not present

## 2023-10-09 DIAGNOSIS — M546 Pain in thoracic spine: Secondary | ICD-10-CM | POA: Diagnosis not present

## 2023-10-15 DIAGNOSIS — M546 Pain in thoracic spine: Secondary | ICD-10-CM | POA: Diagnosis not present

## 2023-10-21 DIAGNOSIS — M546 Pain in thoracic spine: Secondary | ICD-10-CM | POA: Diagnosis not present

## 2023-10-22 ENCOUNTER — Other Ambulatory Visit (HOSPITAL_COMMUNITY): Payer: Self-pay | Admitting: Family Medicine

## 2023-10-22 DIAGNOSIS — R21 Rash and other nonspecific skin eruption: Secondary | ICD-10-CM | POA: Diagnosis not present

## 2023-10-22 DIAGNOSIS — L400 Psoriasis vulgaris: Secondary | ICD-10-CM | POA: Diagnosis not present

## 2023-10-22 DIAGNOSIS — L309 Dermatitis, unspecified: Secondary | ICD-10-CM | POA: Diagnosis not present

## 2023-10-22 DIAGNOSIS — L409 Psoriasis, unspecified: Secondary | ICD-10-CM | POA: Diagnosis not present

## 2023-10-22 DIAGNOSIS — Z79899 Other long term (current) drug therapy: Secondary | ICD-10-CM | POA: Diagnosis not present

## 2023-10-31 DIAGNOSIS — M546 Pain in thoracic spine: Secondary | ICD-10-CM | POA: Diagnosis not present

## 2023-11-05 ENCOUNTER — Other Ambulatory Visit: Payer: Self-pay | Admitting: Obstetrics & Gynecology

## 2023-11-05 NOTE — Telephone Encounter (Signed)
 Medication refill request: valtrex  500mg  Last AEX:  03-10-23 Next AEX: not scheduled Last MMG (if hormonal medication request): n/a Refill authorized: please approve if appropriate

## 2023-11-19 DIAGNOSIS — L408 Other psoriasis: Secondary | ICD-10-CM | POA: Diagnosis not present

## 2023-11-21 ENCOUNTER — Telehealth: Payer: Self-pay | Admitting: Obstetrics and Gynecology

## 2023-11-21 DIAGNOSIS — M8580 Other specified disorders of bone density and structure, unspecified site: Secondary | ICD-10-CM

## 2023-11-21 NOTE — Telephone Encounter (Signed)
 Reminder for bone scan and annual Dr. Tia Flowers

## 2023-11-26 DIAGNOSIS — L400 Psoriasis vulgaris: Secondary | ICD-10-CM | POA: Diagnosis not present

## 2023-11-26 DIAGNOSIS — L2089 Other atopic dermatitis: Secondary | ICD-10-CM | POA: Diagnosis not present

## 2023-12-08 DIAGNOSIS — H40053 Ocular hypertension, bilateral: Secondary | ICD-10-CM | POA: Diagnosis not present

## 2023-12-15 ENCOUNTER — Telehealth (HOSPITAL_BASED_OUTPATIENT_CLINIC_OR_DEPARTMENT_OTHER): Payer: Self-pay | Admitting: Cardiology

## 2023-12-15 NOTE — Telephone Encounter (Signed)
 Please advise if okay for pt to take this medication

## 2023-12-15 NOTE — Telephone Encounter (Signed)
 Pt c/o medication issue:  1. Name of Medication: Rinvoq  2. How are you currently taking this medication (dosage and times per day)? N/A  3. Are you having a reaction (difficulty breathing--STAT)? no  4. What is your medication issue? Crystal from [facility] called in regards to fax that was sent over on 06/23. They are trying to find out if it is OK if patient can take this medication. I see the fax in Routing History within patient's chart. Please advise.

## 2023-12-17 DIAGNOSIS — I700 Atherosclerosis of aorta: Secondary | ICD-10-CM | POA: Insufficient documentation

## 2023-12-17 DIAGNOSIS — G479 Sleep disorder, unspecified: Secondary | ICD-10-CM | POA: Insufficient documentation

## 2023-12-17 DIAGNOSIS — N959 Unspecified menopausal and perimenopausal disorder: Secondary | ICD-10-CM | POA: Insufficient documentation

## 2023-12-17 DIAGNOSIS — E559 Vitamin D deficiency, unspecified: Secondary | ICD-10-CM | POA: Insufficient documentation

## 2023-12-17 DIAGNOSIS — K635 Polyp of colon: Secondary | ICD-10-CM | POA: Insufficient documentation

## 2023-12-17 DIAGNOSIS — N951 Menopausal and female climacteric states: Secondary | ICD-10-CM | POA: Insufficient documentation

## 2023-12-17 NOTE — Telephone Encounter (Signed)
 There is a risk of all cause mortality when using Rinvoq in RA patients with cardiovascular risk factors. Does not appear she will be using it for RA in which case should be ok to take

## 2023-12-17 NOTE — Telephone Encounter (Signed)
 Returned call to [employer]- recommendations reviewed.

## 2024-01-13 ENCOUNTER — Ambulatory Visit: Admitting: Obstetrics and Gynecology

## 2024-01-27 ENCOUNTER — Other Ambulatory Visit: Payer: Self-pay

## 2024-01-27 ENCOUNTER — Telehealth: Payer: Self-pay

## 2024-01-27 DIAGNOSIS — Z780 Asymptomatic menopausal state: Secondary | ICD-10-CM

## 2024-01-27 MED ORDER — PREMARIN 0.625 MG/GM VA CREA: 1.0000 | TOPICAL_CREAM | VAGINAL | 0 refills | Status: AC

## 2024-01-27 MED ORDER — PREMARIN 0.625 MG/GM VA CREA: 1.0000 | TOPICAL_CREAM | VAGINAL | 0 refills | Status: DC

## 2024-01-27 NOTE — Telephone Encounter (Signed)
 Patient LM on RF line: requesting refills on Premarin  vaginal cream.    Med refill request: premarin  vaginal cream Last AEX: 03/10/23  Next AEX: 03/11/24 Last MMG (if hormonal med) 04/14/23 BI-RADS 1 negative Refill authorized: Please Advise?

## 2024-01-27 NOTE — Telephone Encounter (Signed)
.  Med refill request: Premarin  Vaginal cream appl.   Insert one applicator vaginally twice a week  Last AEX: 03/10/23 Next AEX: 03/11/24 Last MMG (if hormonal med) 04/14/23 BI-Rads Cat. 1 Neg  Refill authorized: Please Advise?

## 2024-02-25 ENCOUNTER — Ambulatory Visit: Payer: Self-pay | Admitting: Obstetrics and Gynecology

## 2024-02-25 ENCOUNTER — Encounter: Payer: Self-pay | Admitting: Obstetrics and Gynecology

## 2024-02-25 ENCOUNTER — Ambulatory Visit (INDEPENDENT_AMBULATORY_CARE_PROVIDER_SITE_OTHER)

## 2024-02-25 ENCOUNTER — Other Ambulatory Visit: Payer: Self-pay | Admitting: Obstetrics and Gynecology

## 2024-02-25 DIAGNOSIS — Z780 Asymptomatic menopausal state: Secondary | ICD-10-CM | POA: Diagnosis not present

## 2024-02-25 DIAGNOSIS — M8589 Other specified disorders of bone density and structure, multiple sites: Secondary | ICD-10-CM | POA: Diagnosis not present

## 2024-02-25 DIAGNOSIS — M816 Localized osteoporosis [Lequesne]: Secondary | ICD-10-CM

## 2024-02-25 DIAGNOSIS — M8580 Other specified disorders of bone density and structure, unspecified site: Secondary | ICD-10-CM | POA: Diagnosis not present

## 2024-02-25 NOTE — Progress Notes (Signed)
 Spoke with patient and reviewed dxa scan 2023 -1.8 LFN Frax 17.6% 2.6% 2025 decline noted with positive frax -2.3 Frax 15.9%, 4.1%  No fractures, no HRT use Counseled on treatment options with boniva, fosamax, reclast, prolia, evenity. R/b/a/I of each reviewed She would like to do prolia. Discussed possible 20% pay with [employer] and encouraged her to call. Will send to [facility] for PA  Dr. Glennon  Needs updated CMP with gfr and D3

## 2024-03-09 ENCOUNTER — Other Ambulatory Visit: Payer: Self-pay | Admitting: Obstetrics and Gynecology

## 2024-03-09 DIAGNOSIS — Z0000 Encounter for general adult medical examination without abnormal findings: Secondary | ICD-10-CM

## 2024-03-10 DIAGNOSIS — M546 Pain in thoracic spine: Secondary | ICD-10-CM | POA: Diagnosis not present

## 2024-03-11 ENCOUNTER — Other Ambulatory Visit (HOSPITAL_COMMUNITY)
Admission: RE | Admit: 2024-03-11 | Discharge: 2024-03-11 | Disposition: A | Source: Ambulatory Visit | Attending: Obstetrics and Gynecology | Admitting: Obstetrics and Gynecology

## 2024-03-11 ENCOUNTER — Ambulatory Visit (INDEPENDENT_AMBULATORY_CARE_PROVIDER_SITE_OTHER): Admitting: Obstetrics and Gynecology

## 2024-03-11 ENCOUNTER — Encounter: Payer: Self-pay | Admitting: Obstetrics and Gynecology

## 2024-03-11 VITALS — BP 116/70 | HR 83 | Ht 64.5 in | Wt 131.0 lb

## 2024-03-11 DIAGNOSIS — B009 Herpesviral infection, unspecified: Secondary | ICD-10-CM

## 2024-03-11 DIAGNOSIS — M810 Age-related osteoporosis without current pathological fracture: Secondary | ICD-10-CM | POA: Diagnosis not present

## 2024-03-11 DIAGNOSIS — N952 Postmenopausal atrophic vaginitis: Secondary | ICD-10-CM | POA: Diagnosis not present

## 2024-03-11 DIAGNOSIS — Z9289 Personal history of other medical treatment: Secondary | ICD-10-CM

## 2024-03-11 DIAGNOSIS — Z23 Encounter for immunization: Secondary | ICD-10-CM | POA: Diagnosis not present

## 2024-03-11 DIAGNOSIS — Z1211 Encounter for screening for malignant neoplasm of colon: Secondary | ICD-10-CM

## 2024-03-11 DIAGNOSIS — Z9189 Other specified personal risk factors, not elsewhere classified: Secondary | ICD-10-CM | POA: Insufficient documentation

## 2024-03-11 MED ORDER — INTRAROSA 6.5 MG VA INST: 1.0000 | VAGINAL_INSERT | Freq: Every evening | VAGINAL | 12 refills | Status: DC | PRN

## 2024-03-11 MED ORDER — DENOSUMAB 60 MG/ML ~~LOC~~ SOSY: 60.0000 mg | PREFILLED_SYRINGE | Freq: Once | SUBCUTANEOUS | 0 refills | Status: AC

## 2024-03-11 NOTE — Progress Notes (Signed)
 68 y.o. y.o. female here for annual medicare exam. Patient's last menstrual period was 09/11/2008.    y.o. female here for annual exam. She repots one episode of postcoital spotting.   Patient's last menstrual period was 09/11/2008.   HPI: Postmenopause, on no systemic HRT.   No pelvic pain.  Vaginal dryness with IC, recommend coconut oil.  On Vagifem  twice a week and tried cream and did not help. Has dyspareunia. To try intrarosa. Pap 2024 Neg.  No h/o abnormal Pap.  H/o 5 partners in past and would like annual pap smears.  Mammo Neg 04/14/24  Urine/BMs normal.  BMI good at 22.63.  Doing PT, Yoga and stretching.  Hired a Systems analyst.  Health labs with Fam MD.  Levis 2020.  2023BD Osteopenia T-Score -1.8 LFN frax 17, 2.6%. 2025 -1.7 positive frax 15.9, 4.1% Trying to get Evenity/prolia. Insurance will approve only jubbonti just needs to go through her specialty pharmacy. Declines bisphosphonates with jaw complications. Tried steroids for severe case of whole body psoriasis seeing dermatology soon.    Body mass index is 22.14 kg/m.    Blood pressure 116/70, pulse 83, height 5' 4.5 (1.638 m), weight 131 lb (59.4 kg), last menstrual period 09/11/2008, SpO2 95%.     Component Value Date/Time   DIAGPAP  03/11/2024 1605    - Negative for intraepithelial lesion or malignancy (NILM)   DIAGPAP  03/10/2023 1012    - Negative for intraepithelial lesion or malignancy (NILM)   DIAGPAP  11/24/2020 1122    - Negative for intraepithelial lesion or malignancy (NILM)   HPVHIGH Negative 03/10/2023 1012   ADEQPAP  03/11/2024 1605    Satisfactory for evaluation; transformation zone component ABSENT.   ADEQPAP  03/10/2023 1012    Satisfactory for evaluation; transformation zone component PRESENT.   ADEQPAP  11/24/2020 1122    Satisfactory for evaluation; transformation zone component PRESENT.    GYN HISTORY:    Component Value Date/Time   DIAGPAP  03/11/2024 1605    - Negative for  intraepithelial lesion or malignancy (NILM)   DIAGPAP  03/10/2023 1012    - Negative for intraepithelial lesion or malignancy (NILM)   DIAGPAP  11/24/2020 1122    - Negative for intraepithelial lesion or malignancy (NILM)   HPVHIGH Negative 03/10/2023 1012   ADEQPAP  03/11/2024 1605    Satisfactory for evaluation; transformation zone component ABSENT.   ADEQPAP  03/10/2023 1012    Satisfactory for evaluation; transformation zone component PRESENT.   ADEQPAP  11/24/2020 1122    Satisfactory for evaluation; transformation zone component PRESENT.    OB History  Gravida Para Term Preterm AB Living  4 2 2  2 2   SAB IAB Ectopic Multiple Live Births  2  0      # Outcome Date GA Lbr Len/2nd Weight Sex Type Anes PTL Lv  4 SAB           3 SAB           2 Term           1 Term             Past Medical History:  Diagnosis Date   Anxiety    Depression    Elevated cholesterol    History of colposcopy with cervical biopsy 2004, 2005   LGSIL   HSV (herpes simplex virus) infection    Hx gestational diabetes    Hypertension    Insomnia    Osteopenia 07/2017  T score -1.6 FRAX 17% / 1.8%.  Stable from prior DEXA   osteoporosis    2025 -2.3 with positive frax. needs bone support    Past Surgical History:  Procedure Laterality Date   CESAREAN SECTION  06/15/1992   boy   COLPOSCOPY     COSMETIC SURGERY     SKIN SURGERY     BIRTH MARK REMOVED (R) ARM    Current Outpatient Medications on File Prior to Visit  Medication Sig Dispense Refill   albuterol  (PROVENTIL  HFA;VENTOLIN  HFA) 108 (90 Base) MCG/ACT inhaler Inhale 2 puffs into the lungs every 4 to 6 hours as needed for wheezing or shortness of breath.     amLODipine (NORVASC) 2.5 MG tablet Take 1 tablet by mouth daily.     aspirin  EC 81 MG tablet Take 1 tablet (81 mg total) by mouth daily. Swallow whole.     CALCIUM  PO Take 600 mg by mouth daily. Reported on 06/03/2015     Cholecalciferol (VITAMIN D ) 2000 units CAPS Take 2,000  Units by mouth daily.     citalopram  (CELEXA ) 20 MG tablet Take 20 mg by mouth daily.     conjugated estrogens  (PREMARIN ) vaginal cream Place 1 Applicatorful vaginally 2 (two) times a week. 42.5 g 0   Estradiol  (VAGIFEM ) 10 MCG TABS vaginal tablet Place 1 tablet (10 mcg total) vaginally 2 (two) times a week. 8 tablet 12   ezetimibe  (ZETIA ) 10 MG tablet Take 1 tablet (10 mg total) by mouth daily. 90 tablet 3   lisinopril (ZESTRIL) 20 MG tablet Take 20 mg by mouth daily.     SKYRIZI PEN 150 MG/ML pen      TURMERIC PO Take by mouth.     valACYclovir  (VALTREX ) 500 MG tablet TAKE ONE TABLET TWICE A DAY FOR 3-5 DAYS FOR OUTBREAK,THEN AS NEEDED. 30 tablet 0   No current facility-administered medications on file prior to visit.    Social History   Socioeconomic History   Marital status: Married    Spouse name: Not on file   Number of children: Not on file   Years of education: Not on file   Highest education level: Not on file  Occupational History   Not on file  Tobacco Use   Smoking status: Former    Passive exposure: Past   Smokeless tobacco: Never  Vaping Use   Vaping status: Never Used  Substance and Sexual Activity   Alcohol use: Yes    Alcohol/week: 7.0 standard drinks of alcohol    Types: 7 Standard drinks or equivalent per week    Comment: occ   Drug use: No   Sexual activity: Yes    Partners: Male    Birth control/protection: Post-menopausal    Comment: 1st intercourse 68 yo-More than 5 partners  Other Topics Concern   Not on file  Social History Narrative   Not on file   Social Drivers of Health   Financial Resource Strain: Not on file  Food Insecurity: Not on file  Transportation Needs: Not on file  Physical Activity: Not on file  Stress: Not on file  Social Connections: Not on file  Intimate Partner Violence: Not on file    Family History  Problem Relation Age of Onset   Diabetes Mother    Hypertension Mother    Heart disease Mother    Dementia Mother     Hypertension Father    Heart disease Father    Cancer Father  PROSTATE, BLADDER     Allergies  Allergen Reactions   Codeine Itching      Patient's last menstrual period was Patient's last menstrual period was 09/11/2008.SABRA            Review of Systems Alls systems reviewed and are negative.     Physical Exam Constitutional:      Appearance: Normal appearance.  Genitourinary:     Vulva and urethral meatus normal.     No lesions in the vagina.     Right Labia: No rash, lesions or skin changes.    Left Labia: No lesions, skin changes or rash.    No vaginal discharge or tenderness.     No vaginal prolapse present.    No vaginal atrophy present.     Right Adnexa: not tender, not palpable and no mass present.    Left Adnexa: not tender, not palpable and no mass present.    No cervical motion tenderness or discharge.     Uterus is not enlarged, tender or irregular.  Breasts:    Right: Normal.     Left: Normal.  HENT:     Head: Normocephalic.  Neck:     Thyroid : No thyroid  mass, thyromegaly or thyroid  tenderness.  Cardiovascular:     Rate and Rhythm: Normal rate and regular rhythm.     Heart sounds: Normal heart sounds, S1 normal and S2 normal.  Pulmonary:     Effort: Pulmonary effort is normal.     Breath sounds: Normal breath sounds and air entry.  Abdominal:     General: There is no distension.     Palpations: Abdomen is soft. There is no mass.     Tenderness: There is no abdominal tenderness. There is no guarding or rebound.  Musculoskeletal:        General: Normal range of motion.     Cervical back: Full passive range of motion without pain, normal range of motion and neck supple. No tenderness.     Right lower leg: No edema.     Left lower leg: No edema.  Neurological:     Mental Status: She is alert.  Skin:    General: Skin is warm.  Psychiatric:        Mood and Affect: Mood normal.        Behavior: Behavior normal.        Thought Content:  Thought content normal.  Vitals and nursing note reviewed. Exam conducted with a chaperone present.      Geni, CMA was present for the exam A:         Well Woman GYN exam Osteoporosis (positive frax)                             P:        Pap smear not indicated Encouraged annual mammogram screening Colon cancer screening up-to-date DXA up-to-date Labs and immunizations to do with PMD Discussed breast self exams Encouraged healthy lifestyle practices Encouraged Vit D and Calcium    No follow-ups on file.  Almarie MARLA Carpen

## 2024-03-15 DIAGNOSIS — Z79899 Other long term (current) drug therapy: Secondary | ICD-10-CM | POA: Diagnosis not present

## 2024-03-15 DIAGNOSIS — L400 Psoriasis vulgaris: Secondary | ICD-10-CM | POA: Diagnosis not present

## 2024-03-15 LAB — CYTOLOGY - PAP
Adequacy: ABSENT
Diagnosis: NEGATIVE

## 2024-03-16 ENCOUNTER — Encounter: Payer: Self-pay | Admitting: Obstetrics and Gynecology

## 2024-03-16 ENCOUNTER — Ambulatory Visit: Payer: Self-pay | Admitting: Obstetrics and Gynecology

## 2024-03-16 ENCOUNTER — Other Ambulatory Visit: Payer: Self-pay | Admitting: Obstetrics and Gynecology

## 2024-03-16 ENCOUNTER — Telehealth: Payer: Self-pay | Admitting: Obstetrics and Gynecology

## 2024-03-16 MED ORDER — DENOSUMAB-BBDZ 60 MG/ML ~~LOC~~ SOSY: 60.0000 mL | PREFILLED_SYRINGE | SUBCUTANEOUS | 6 refills | Status: DC

## 2024-03-16 MED ORDER — INTRAROSA 6.5 MG VA INST: 1.0000 | VAGINAL_INSERT | Freq: Every evening | VAGINAL | 12 refills | Status: AC | PRN

## 2024-03-16 MED ORDER — DENOSUMAB-BBDZ 60 MG/ML ~~LOC~~ SOSY: 60.0000 mL | PREFILLED_SYRINGE | SUBCUTANEOUS | 6 refills | Status: AC

## 2024-03-16 MED ORDER — ROMOSOZUMAB-AQQG 105 MG/1.17ML ~~LOC~~ SOSY: 210.0000 mg | PREFILLED_SYRINGE | Freq: Once | SUBCUTANEOUS | 0 refills | Status: AC

## 2024-03-16 MED ORDER — DENOSUMAB 60 MG/ML ~~LOC~~ SOSY: 60.0000 mg | PREFILLED_SYRINGE | Freq: Once | SUBCUTANEOUS | 0 refills | Status: AC

## 2024-03-16 NOTE — Telephone Encounter (Signed)
 Patient called zero copay for prolia At [facility]. 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 To send to [place] to [facility] for cost comparison of both and see what her insurance will cover. Discussed the medication can be sent here and injection given here. She will schedule both labs and injection. Will need labs done to start either and she will decide. Encouraged daily vit D and calcium   To let Damien know as well.

## 2024-03-17 ENCOUNTER — Telehealth: Payer: Self-pay

## 2024-03-17 NOTE — Telephone Encounter (Signed)
 Intrarosa is requiring a PA, I have submitted the PA with Covermymeds.  Key: [id number] DX: N89.8, N94.19, N95.1  Pt has been made aware of this.

## 2024-03-19 NOTE — Telephone Encounter (Signed)
 Your request has been approved 08-OCT-25:31-DEC-26 Intrarosa 6.5MG  [place] INST Quantity:28;

## 2024-03-24 ENCOUNTER — Other Ambulatory Visit: Payer: Self-pay | Admitting: *Deleted

## 2024-03-24 ENCOUNTER — Encounter: Payer: Self-pay | Admitting: Obstetrics and Gynecology

## 2024-03-24 DIAGNOSIS — M546 Pain in thoracic spine: Secondary | ICD-10-CM | POA: Diagnosis not present

## 2024-03-24 DIAGNOSIS — M810 Age-related osteoporosis without current pathological fracture: Secondary | ICD-10-CM

## 2024-03-24 MED ORDER — DENOSUMAB-BBDZ 60 MG/ML ~~LOC~~ SOSY: 60.0000 mg | PREFILLED_SYRINGE | Freq: Once | SUBCUTANEOUS | Status: AC

## 2024-03-24 NOTE — Telephone Encounter (Signed)
 See Jubbonti referral.   Encounter closed.

## 2024-03-25 ENCOUNTER — Other Ambulatory Visit: Payer: Self-pay | Admitting: Obstetrics and Gynecology

## 2024-03-25 ENCOUNTER — Other Ambulatory Visit

## 2024-03-25 DIAGNOSIS — M816 Localized osteoporosis [Lequesne]: Secondary | ICD-10-CM

## 2024-03-25 NOTE — Addendum Note (Signed)
 Addended by: BRUTUS KATE SAILOR on: 03/25/2024 03:50 PM   Modules accepted: Orders

## 2024-03-26 ENCOUNTER — Ambulatory Visit: Payer: Self-pay | Admitting: Obstetrics and Gynecology

## 2024-03-26 LAB — COMPREHENSIVE METABOLIC PANEL WITH GFR
AG Ratio: 1.8 (calc) (ref 1.0–2.5)
ALT: 21 U/L (ref 6–29)
AST: 20 U/L (ref 10–35)
Albumin: 4.4 g/dL (ref 3.6–5.1)
Alkaline phosphatase (APISO): 64 U/L (ref 37–153)
BUN/Creatinine Ratio: 14 (calc) (ref 6–22)
BUN: 15 mg/dL (ref 7–25)
CO2: 24 mmol/L (ref 20–32)
Calcium: 9.4 mg/dL (ref 8.6–10.4)
Chloride: 100 mmol/L (ref 98–110)
Creat: 1.07 mg/dL — ABNORMAL HIGH (ref 0.60–1.00)
Globulin: 2.4 g/dL (ref 1.9–3.7)
Glucose, Bld: 104 mg/dL — ABNORMAL HIGH (ref 65–99)
Potassium: 4.4 mmol/L (ref 3.5–5.3)
Sodium: 135 mmol/L (ref 135–146)
Total Bilirubin: 0.4 mg/dL (ref 0.2–1.2)
Total Protein: 6.8 g/dL (ref 6.1–8.1)
eGFR: 56 mL/min/1.73m2 — ABNORMAL LOW (ref >=60)

## 2024-03-26 LAB — VITAMIN D 25 HYDROXY (VIT D DEFICIENCY, FRACTURES): Vit D, 25-Hydroxy: 27 ng/mL — ABNORMAL LOW (ref 30–100)

## 2024-03-29 DIAGNOSIS — L400 Psoriasis vulgaris: Secondary | ICD-10-CM | POA: Diagnosis not present

## 2024-03-31 ENCOUNTER — Other Ambulatory Visit: Payer: Self-pay | Admitting: *Deleted

## 2024-03-31 DIAGNOSIS — M810 Age-related osteoporosis without current pathological fracture: Secondary | ICD-10-CM

## 2024-03-31 MED ORDER — DENOSUMAB-BBDZ 60 MG/ML ~~LOC~~ SOSY
60.0000 mg | PREFILLED_SYRINGE | Freq: Once | SUBCUTANEOUS | Status: AC
  Administered 2024-04-08: 60 mg via SUBCUTANEOUS

## 2024-04-01 DIAGNOSIS — L400 Psoriasis vulgaris: Secondary | ICD-10-CM | POA: Diagnosis not present

## 2024-04-05 DIAGNOSIS — M546 Pain in thoracic spine: Secondary | ICD-10-CM | POA: Diagnosis not present

## 2024-04-05 DIAGNOSIS — L400 Psoriasis vulgaris: Secondary | ICD-10-CM | POA: Diagnosis not present

## 2024-04-08 ENCOUNTER — Ambulatory Visit (INDEPENDENT_AMBULATORY_CARE_PROVIDER_SITE_OTHER)

## 2024-04-08 DIAGNOSIS — G4700 Insomnia, unspecified: Secondary | ICD-10-CM | POA: Diagnosis not present

## 2024-04-08 DIAGNOSIS — E7800 Pure hypercholesterolemia, unspecified: Secondary | ICD-10-CM | POA: Diagnosis not present

## 2024-04-08 DIAGNOSIS — L308 Other specified dermatitis: Secondary | ICD-10-CM | POA: Diagnosis not present

## 2024-04-08 DIAGNOSIS — M810 Age-related osteoporosis without current pathological fracture: Secondary | ICD-10-CM

## 2024-04-08 DIAGNOSIS — Z1331 Encounter for screening for depression: Secondary | ICD-10-CM | POA: Diagnosis not present

## 2024-04-08 DIAGNOSIS — I10 Essential (primary) hypertension: Secondary | ICD-10-CM | POA: Diagnosis not present

## 2024-04-08 DIAGNOSIS — F325 Major depressive disorder, single episode, in full remission: Secondary | ICD-10-CM | POA: Diagnosis not present

## 2024-04-08 DIAGNOSIS — Z0000 Encounter for general adult medical examination without abnormal findings: Secondary | ICD-10-CM | POA: Diagnosis not present

## 2024-04-08 NOTE — Progress Notes (Signed)
 1st Jubbonti injection given in left arm (SQ). Pt tolerated injection well. (IC, CCMA)

## 2024-04-12 DIAGNOSIS — L400 Psoriasis vulgaris: Secondary | ICD-10-CM | POA: Diagnosis not present

## 2024-04-12 DIAGNOSIS — M546 Pain in thoracic spine: Secondary | ICD-10-CM | POA: Diagnosis not present

## 2024-04-14 ENCOUNTER — Ambulatory Visit
Admission: RE | Admit: 2024-04-14 | Discharge: 2024-04-14 | Disposition: A | Discharge status: Home / Self Care | Source: Ambulatory Visit | Attending: Obstetrics and Gynecology | Admitting: Obstetrics and Gynecology

## 2024-04-14 DIAGNOSIS — Z1231 Encounter for screening mammogram for malignant neoplasm of breast: Secondary | ICD-10-CM | POA: Diagnosis not present

## 2024-04-14 DIAGNOSIS — Z0000 Encounter for general adult medical examination without abnormal findings: Secondary | ICD-10-CM

## 2024-04-15 DIAGNOSIS — L400 Psoriasis vulgaris: Secondary | ICD-10-CM | POA: Diagnosis not present

## 2024-04-15 DIAGNOSIS — Z79899 Other long term (current) drug therapy: Secondary | ICD-10-CM | POA: Diagnosis not present

## 2024-04-15 DIAGNOSIS — D692 Other nonthrombocytopenic purpura: Secondary | ICD-10-CM | POA: Diagnosis not present

## 2024-04-15 DIAGNOSIS — L814 Other melanin hyperpigmentation: Secondary | ICD-10-CM | POA: Diagnosis not present

## 2024-04-15 DIAGNOSIS — D1801 Hemangioma of skin and subcutaneous tissue: Secondary | ICD-10-CM | POA: Diagnosis not present

## 2024-04-15 DIAGNOSIS — L821 Other seborrheic keratosis: Secondary | ICD-10-CM | POA: Diagnosis not present

## 2024-04-19 ENCOUNTER — Ambulatory Visit: Payer: Self-pay | Admitting: Obstetrics and Gynecology

## 2024-04-19 DIAGNOSIS — M546 Pain in thoracic spine: Secondary | ICD-10-CM | POA: Diagnosis not present

## 2024-04-19 DIAGNOSIS — L400 Psoriasis vulgaris: Secondary | ICD-10-CM | POA: Diagnosis not present

## 2024-04-20 ENCOUNTER — Other Ambulatory Visit (HOSPITAL_COMMUNITY): Payer: Self-pay

## 2024-04-20 ENCOUNTER — Other Ambulatory Visit: Payer: Self-pay

## 2024-04-26 DIAGNOSIS — L400 Psoriasis vulgaris: Secondary | ICD-10-CM | POA: Diagnosis not present

## 2024-04-26 DIAGNOSIS — M546 Pain in thoracic spine: Secondary | ICD-10-CM | POA: Diagnosis not present

## 2024-04-27 ENCOUNTER — Other Ambulatory Visit: Payer: Self-pay

## 2024-04-27 ENCOUNTER — Other Ambulatory Visit (HOSPITAL_COMMUNITY): Payer: Self-pay

## 2024-04-27 MED ORDER — BIMZELX 320 MG/2ML ~~LOC~~ SOAJ
SUBCUTANEOUS | 11 refills | Status: AC
  Filled 2024-04-28: qty 2, 28d supply, fill #0
  Filled 2024-05-21: qty 2, 28d supply, fill #1
  Filled 2024-06-21 – 2024-07-05 (×3): qty 2, 28d supply, fill #2

## 2024-04-27 NOTE — Progress Notes (Signed)
 Pharmacy Patient Advocate Encounter  Insurance verification completed.   The patient is insured through [facility] ADVANTAGE/RX ADVANCE   Ran test claim for Bimzelx. Co-pay is $0.  This test claim was processed through [facility]- copay amounts may vary at other pharmacies due to pharmacy/plan contracts, or as the patient moves through the different stages of their insurance plan.

## 2024-04-28 ENCOUNTER — Other Ambulatory Visit: Payer: Self-pay

## 2024-04-28 NOTE — Progress Notes (Signed)
 Specialty Pharmacy Initial Fill Coordination Note  ANNY SAYLER is a 68 y.o. female contacted today regarding initial fill of specialty medication(s) Bimekizumab-bkzx (Bimzelx)   Patient requested [name] at [facility] at [place] date: 04/29/24   Medication will be filled on: 04/29/24    Patient is aware of $0 copayment.

## 2024-04-28 NOTE — Progress Notes (Signed)
 Specialty Pharmacy Initiation Note   Kristina Moody is a 68 y.o. female who will be followed by the specialty pharmacy service for RxSp Psoriasis    Review of administration, indication, effectiveness, safety, potential side effects, storage/disposable, and missed dose instructions occurred today for patient's specialty medication(s) Bimekizumab-bkzx (Bimzelx)     Patient/Caregiver did not have any additional questions or concerns.   Patient's therapy is appropriate to: Continue (Patient has already received week 0 and week 4 doses and is seeing positive results, transferring pharmacies.)    Goals Addressed             This Visit's Progress    Minimize recurrence of flares       Patient is on track. Patient will maintain adherence. Patient has already received week 0 and week 4 doses and is seeing positive results, transferring pharmacies.          Lonald Troiani M Lamarius Dirr Specialty Pharmacist

## 2024-04-29 ENCOUNTER — Other Ambulatory Visit: Payer: Self-pay

## 2024-04-29 DIAGNOSIS — L400 Psoriasis vulgaris: Secondary | ICD-10-CM | POA: Diagnosis not present

## 2024-05-03 DIAGNOSIS — L400 Psoriasis vulgaris: Secondary | ICD-10-CM | POA: Diagnosis not present

## 2024-05-13 DIAGNOSIS — L400 Psoriasis vulgaris: Secondary | ICD-10-CM | POA: Diagnosis not present

## 2024-05-17 DIAGNOSIS — M546 Pain in thoracic spine: Secondary | ICD-10-CM | POA: Diagnosis not present

## 2024-05-17 DIAGNOSIS — L400 Psoriasis vulgaris: Secondary | ICD-10-CM | POA: Diagnosis not present

## 2024-05-19 DIAGNOSIS — L400 Psoriasis vulgaris: Secondary | ICD-10-CM | POA: Diagnosis not present

## 2024-05-21 ENCOUNTER — Other Ambulatory Visit (HOSPITAL_COMMUNITY): Payer: Self-pay

## 2024-05-26 ENCOUNTER — Other Ambulatory Visit: Payer: Self-pay | Admitting: Pharmacy Technician

## 2024-05-26 ENCOUNTER — Other Ambulatory Visit: Payer: Self-pay

## 2024-05-26 NOTE — Progress Notes (Signed)
 Specialty Pharmacy Refill Coordination Note  Kristina Moody is a 68 y.o. female contacted today regarding refills of specialty medication(s) Bimekizumab -bkzx (Bimzelx )   Patient requested [name] at [facility] at [place] date: 05/28/24   Medication will be filled on: 05/27/24

## 2024-06-18 ENCOUNTER — Other Ambulatory Visit: Payer: Self-pay

## 2024-06-21 ENCOUNTER — Other Ambulatory Visit: Payer: Self-pay

## 2024-06-22 ENCOUNTER — Other Ambulatory Visit (HOSPITAL_COMMUNITY): Payer: Self-pay

## 2024-07-02 ENCOUNTER — Other Ambulatory Visit: Payer: Self-pay

## 2024-07-02 ENCOUNTER — Other Ambulatory Visit (HOSPITAL_COMMUNITY): Payer: Self-pay

## 2024-07-02 NOTE — Progress Notes (Signed)
 Benefits Investigation Started  Reason: Copay is $2,055.47  Routed to: Tiffany

## 2024-07-05 ENCOUNTER — Other Ambulatory Visit: Payer: Self-pay

## 2024-07-05 NOTE — Progress Notes (Signed)
 Patient aware of $2,055.47 copay and will pay full amount.

## 2024-07-05 NOTE — Progress Notes (Signed)
 Specialty Pharmacy Initial Fill Coordination Note  Kristina Moody is a 69 y.o. female contacted today regarding initial fill of specialty medication(s) Bimekizumab -bkzx (Bimzelx )   Patient requested [name] at [facility] at [place] date: 07/09/24   Medication will be filled on: 07/08/24    Patient is aware of $2,055.47 copayment.

## 2024-07-08 ENCOUNTER — Other Ambulatory Visit: Payer: Self-pay
